# Patient Record
Sex: Male | Born: 1960 | Race: White | Hispanic: No | Marital: Married | Smoking: Never smoker
Health system: Southern US, Community
[De-identification: ages and names within clinical notes are randomized; demographics above are authoritative.]

## PROBLEM LIST (undated history)

## (undated) DIAGNOSIS — Z7901 Long term (current) use of anticoagulants: Secondary | ICD-10-CM

## (undated) DIAGNOSIS — O2230 Deep phlebothrombosis in pregnancy, unspecified trimester: Secondary | ICD-10-CM

## (undated) DIAGNOSIS — J309 Allergic rhinitis, unspecified: Secondary | ICD-10-CM

## (undated) DIAGNOSIS — K635 Polyp of colon: Secondary | ICD-10-CM

## (undated) DIAGNOSIS — J45909 Unspecified asthma, uncomplicated: Secondary | ICD-10-CM

## (undated) DIAGNOSIS — K219 Gastro-esophageal reflux disease without esophagitis: Secondary | ICD-10-CM

## (undated) DIAGNOSIS — E538 Deficiency of other specified B group vitamins: Secondary | ICD-10-CM

## (undated) DIAGNOSIS — D6852 Prothrombin gene mutation: Secondary | ICD-10-CM

## (undated) HISTORY — PX: FRACTURE SURGERY: SHX138

## (undated) HISTORY — PX: COLONOSCOPY: SHX174

## (undated) HISTORY — PX: FLEXIBLE SIGMOIDOSCOPY: SHX1649

## (undated) HISTORY — PX: HERNIA REPAIR: SHX51

## (undated) HISTORY — PX: NASAL SINUS SURGERY: SHX719

---

## 2002-02-05 DIAGNOSIS — O2230 Deep phlebothrombosis in pregnancy, unspecified trimester: Secondary | ICD-10-CM

## 2002-02-05 HISTORY — DX: Deep phlebothrombosis in pregnancy, unspecified trimester: O22.30

## 2005-11-12 ENCOUNTER — Ambulatory Visit: Payer: Self-pay | Admitting: Unknown Physician Specialty

## 2006-05-09 ENCOUNTER — Ambulatory Visit: Payer: Self-pay | Admitting: Otolaryngology

## 2007-06-18 ENCOUNTER — Ambulatory Visit: Payer: Self-pay | Admitting: Unknown Physician Specialty

## 2007-10-15 ENCOUNTER — Ambulatory Visit: Payer: Self-pay | Admitting: Internal Medicine

## 2011-04-14 ENCOUNTER — Emergency Department: Payer: Self-pay | Admitting: Emergency Medicine

## 2011-05-17 ENCOUNTER — Ambulatory Visit: Payer: Self-pay | Admitting: Unknown Physician Specialty

## 2011-05-18 LAB — PATHOLOGY REPORT

## 2013-06-09 DIAGNOSIS — I82409 Acute embolism and thrombosis of unspecified deep veins of unspecified lower extremity: Secondary | ICD-10-CM | POA: Insufficient documentation

## 2013-06-09 DIAGNOSIS — Z86718 Personal history of other venous thrombosis and embolism: Secondary | ICD-10-CM | POA: Insufficient documentation

## 2013-12-28 DIAGNOSIS — K219 Gastro-esophageal reflux disease without esophagitis: Secondary | ICD-10-CM | POA: Insufficient documentation

## 2014-06-18 ENCOUNTER — Other Ambulatory Visit: Payer: Self-pay | Admitting: Internal Medicine

## 2014-06-18 DIAGNOSIS — M5412 Radiculopathy, cervical region: Secondary | ICD-10-CM

## 2014-06-26 ENCOUNTER — Ambulatory Visit
Admission: RE | Admit: 2014-06-26 | Discharge: 2014-06-26 | Disposition: A | Discharge status: Home / Self Care | Payer: No Typology Code available for payment source | Source: Ambulatory Visit | Attending: Internal Medicine | Admitting: Internal Medicine

## 2014-06-26 DIAGNOSIS — M47812 Spondylosis without myelopathy or radiculopathy, cervical region: Secondary | ICD-10-CM | POA: Insufficient documentation

## 2014-06-26 DIAGNOSIS — M5010 Cervical disc disorder with radiculopathy, unspecified cervical region: Secondary | ICD-10-CM | POA: Diagnosis present

## 2014-06-26 DIAGNOSIS — M5382 Other specified dorsopathies, cervical region: Secondary | ICD-10-CM | POA: Insufficient documentation

## 2014-06-26 DIAGNOSIS — M5030 Other cervical disc degeneration, unspecified cervical region: Secondary | ICD-10-CM | POA: Insufficient documentation

## 2014-06-26 DIAGNOSIS — M5412 Radiculopathy, cervical region: Secondary | ICD-10-CM

## 2015-08-03 DIAGNOSIS — E538 Deficiency of other specified B group vitamins: Secondary | ICD-10-CM | POA: Insufficient documentation

## 2016-02-21 DIAGNOSIS — I82519 Chronic embolism and thrombosis of unspecified femoral vein: Secondary | ICD-10-CM | POA: Diagnosis not present

## 2016-04-19 DIAGNOSIS — I82519 Chronic embolism and thrombosis of unspecified femoral vein: Secondary | ICD-10-CM | POA: Diagnosis not present

## 2016-05-24 DIAGNOSIS — Z85828 Personal history of other malignant neoplasm of skin: Secondary | ICD-10-CM | POA: Diagnosis not present

## 2016-05-24 DIAGNOSIS — D225 Melanocytic nevi of trunk: Secondary | ICD-10-CM | POA: Diagnosis not present

## 2016-05-24 DIAGNOSIS — L821 Other seborrheic keratosis: Secondary | ICD-10-CM | POA: Diagnosis not present

## 2016-05-24 DIAGNOSIS — L570 Actinic keratosis: Secondary | ICD-10-CM | POA: Diagnosis not present

## 2016-06-01 DIAGNOSIS — I82519 Chronic embolism and thrombosis of unspecified femoral vein: Secondary | ICD-10-CM | POA: Diagnosis not present

## 2016-07-26 DIAGNOSIS — Z0000 Encounter for general adult medical examination without abnormal findings: Secondary | ICD-10-CM | POA: Diagnosis not present

## 2016-08-03 DIAGNOSIS — Z0000 Encounter for general adult medical examination without abnormal findings: Secondary | ICD-10-CM | POA: Diagnosis not present

## 2016-08-03 DIAGNOSIS — I82401 Acute embolism and thrombosis of unspecified deep veins of right lower extremity: Secondary | ICD-10-CM | POA: Diagnosis not present

## 2016-08-03 DIAGNOSIS — E538 Deficiency of other specified B group vitamins: Secondary | ICD-10-CM | POA: Diagnosis not present

## 2016-08-31 DIAGNOSIS — I82402 Acute embolism and thrombosis of unspecified deep veins of left lower extremity: Secondary | ICD-10-CM | POA: Diagnosis not present

## 2016-08-31 DIAGNOSIS — I82401 Acute embolism and thrombosis of unspecified deep veins of right lower extremity: Secondary | ICD-10-CM | POA: Diagnosis not present

## 2016-08-31 DIAGNOSIS — Z86010 Personal history of colonic polyps: Secondary | ICD-10-CM | POA: Diagnosis not present

## 2016-10-04 DIAGNOSIS — I82519 Chronic embolism and thrombosis of unspecified femoral vein: Secondary | ICD-10-CM | POA: Diagnosis not present

## 2016-11-06 DIAGNOSIS — I82519 Chronic embolism and thrombosis of unspecified femoral vein: Secondary | ICD-10-CM | POA: Diagnosis not present

## 2016-11-14 DIAGNOSIS — H00012 Hordeolum externum right lower eyelid: Secondary | ICD-10-CM | POA: Diagnosis not present

## 2016-12-06 ENCOUNTER — Encounter: Payer: Self-pay | Admitting: *Deleted

## 2016-12-07 ENCOUNTER — Ambulatory Visit
Admission: RE | Admit: 2016-12-07 | Discharge: 2016-12-07 | Disposition: A | Discharge status: Home / Self Care | Payer: Commercial Managed Care - HMO | Source: Ambulatory Visit | Attending: Unknown Physician Specialty | Admitting: Unknown Physician Specialty

## 2016-12-07 ENCOUNTER — Encounter
Admission: RE | Disposition: A | Discharge status: Home / Self Care | Payer: Self-pay | Source: Ambulatory Visit | Attending: Unknown Physician Specialty

## 2016-12-07 ENCOUNTER — Encounter: Payer: Self-pay | Admitting: *Deleted

## 2016-12-07 ENCOUNTER — Ambulatory Visit: Payer: Commercial Managed Care - HMO | Admitting: Certified Registered Nurse Anesthetist

## 2016-12-07 DIAGNOSIS — D120 Benign neoplasm of cecum: Secondary | ICD-10-CM | POA: Insufficient documentation

## 2016-12-07 DIAGNOSIS — Z86718 Personal history of other venous thrombosis and embolism: Secondary | ICD-10-CM | POA: Diagnosis not present

## 2016-12-07 DIAGNOSIS — K648 Other hemorrhoids: Secondary | ICD-10-CM | POA: Insufficient documentation

## 2016-12-07 DIAGNOSIS — K5730 Diverticulosis of large intestine without perforation or abscess without bleeding: Secondary | ICD-10-CM | POA: Diagnosis not present

## 2016-12-07 DIAGNOSIS — D123 Benign neoplasm of transverse colon: Secondary | ICD-10-CM | POA: Diagnosis not present

## 2016-12-07 DIAGNOSIS — Z1211 Encounter for screening for malignant neoplasm of colon: Secondary | ICD-10-CM | POA: Insufficient documentation

## 2016-12-07 DIAGNOSIS — Z7901 Long term (current) use of anticoagulants: Secondary | ICD-10-CM | POA: Insufficient documentation

## 2016-12-07 DIAGNOSIS — D122 Benign neoplasm of ascending colon: Secondary | ICD-10-CM | POA: Diagnosis not present

## 2016-12-07 DIAGNOSIS — J45909 Unspecified asthma, uncomplicated: Secondary | ICD-10-CM | POA: Diagnosis not present

## 2016-12-07 DIAGNOSIS — Z86010 Personal history of colonic polyps: Secondary | ICD-10-CM | POA: Diagnosis not present

## 2016-12-07 DIAGNOSIS — D125 Benign neoplasm of sigmoid colon: Secondary | ICD-10-CM | POA: Diagnosis not present

## 2016-12-07 DIAGNOSIS — K635 Polyp of colon: Secondary | ICD-10-CM | POA: Diagnosis not present

## 2016-12-07 DIAGNOSIS — D126 Benign neoplasm of colon, unspecified: Secondary | ICD-10-CM | POA: Diagnosis not present

## 2016-12-07 HISTORY — PX: COLONOSCOPY WITH PROPOFOL: SHX5780

## 2016-12-07 HISTORY — DX: Deficiency of other specified B group vitamins: E53.8

## 2016-12-07 HISTORY — DX: Allergic rhinitis, unspecified: J30.9

## 2016-12-07 HISTORY — DX: Deep phlebothrombosis in pregnancy, unspecified trimester: O22.30

## 2016-12-07 HISTORY — DX: Unspecified asthma, uncomplicated: J45.909

## 2016-12-07 HISTORY — DX: Gastro-esophageal reflux disease without esophagitis: K21.9

## 2016-12-07 HISTORY — DX: Polyp of colon: K63.5

## 2016-12-07 SURGERY — COLONOSCOPY WITH PROPOFOL
Anesthesia: General

## 2016-12-07 MED ORDER — PROPOFOL 10 MG/ML IV BOLUS
INTRAVENOUS | Status: AC
  Filled 2016-12-07: qty 20

## 2016-12-07 MED ORDER — PROPOFOL 500 MG/50ML IV EMUL
INTRAVENOUS | Status: AC
  Filled 2016-12-07: qty 50

## 2016-12-07 MED ORDER — PROPOFOL 10 MG/ML IV BOLUS
INTRAVENOUS | Status: DC | PRN
  Administered 2016-12-07: 30 mg via INTRAVENOUS
  Administered 2016-12-07: 20 mg via INTRAVENOUS
  Administered 2016-12-07: 40 mg via INTRAVENOUS

## 2016-12-07 MED ORDER — LIDOCAINE HCL (PF) 2 % IJ SOLN
INTRAMUSCULAR | Status: AC
  Filled 2016-12-07: qty 10

## 2016-12-07 MED ORDER — SODIUM CHLORIDE 0.9 % IV SOLN
INTRAVENOUS | Status: DC
  Administered 2016-12-07: 09:00:00 via INTRAVENOUS

## 2016-12-07 MED ORDER — LIDOCAINE HCL (CARDIAC) 20 MG/ML IV SOLN
INTRAVENOUS | Status: DC | PRN
  Administered 2016-12-07: 50 mg via INTRAVENOUS

## 2016-12-07 MED ORDER — PROPOFOL 500 MG/50ML IV EMUL
INTRAVENOUS | Status: DC | PRN
  Administered 2016-12-07: 160 ug/kg/min via INTRAVENOUS
  Administered 2016-12-07: 09:00:00 via INTRAVENOUS

## 2016-12-07 MED ORDER — SODIUM CHLORIDE 0.9 % IJ SOLN
INTRAMUSCULAR | Status: DC | PRN
  Administered 2016-12-07: 5 mL

## 2016-12-07 MED ORDER — PHENYLEPHRINE HCL 10 MG/ML IJ SOLN
INTRAMUSCULAR | Status: DC | PRN
  Administered 2016-12-07 (×4): 100 ug via INTRAVENOUS
  Administered 2016-12-07: 200 ug via INTRAVENOUS
  Administered 2016-12-07 (×2): 100 ug via INTRAVENOUS
  Administered 2016-12-07 (×2): 200 ug via INTRAVENOUS

## 2016-12-07 NOTE — Anesthesia Post-op Follow-up Note (Deleted)
Anesthesia QCDR form completed.        

## 2016-12-07 NOTE — Op Note (Signed)
[facility] Gastroenterology Patient Name: George Sharp Procedure Date: 12/07/2016 8:41 AM MRN: [id number] Account #: [id number] Date of Birth: 1960-04-22 Admit Type: Outpatient Age: 56 Room: [facility] ENDO ROOM 3 Gender: Male Note Status: Finalized Procedure:            Colonoscopy Indications:          High risk colon cancer surveillance: Personal history                        of colonic polyps Providers:            Manya Silvas, MD Referring MD:         Rusty Aus, MD (Referring MD) Medicines:            Propofol per Anesthesia Complications:        No immediate complications. Procedure:            Pre-Anesthesia Assessment:                       - After reviewing the risks and benefits, the patient                        was deemed in satisfactory condition to undergo the                        procedure.                       After obtaining informed consent, the colonoscope was                        passed under direct vision. Throughout the procedure,                        the patient's blood pressure, pulse, and oxygen                        saturations were monitored continuously. The                        Colonoscope was introduced through the anus and                        advanced to the the cecum, identified by appendiceal                        orifice and ileocecal valve. The colonoscopy was                        somewhat difficult due to a tortuous colon. Successful                        completion of the procedure was aided by applying                        abdominal pressure. The patient tolerated the procedure                        well. The quality of the bowel preparation was good. Findings:      A medium polyp was found in the cecum. The polyp  was sessile. The polyp       was removed with a saline injection-lift technique using a hot snare.       Resection and retrieval were complete. To prevent bleeding after the        polypectomy, two hemostatic clips were successfully placed. There was no       bleeding at the end of the procedure.      A small polyp was found in the ascending colon. The polyp was sessile.       The polyp was removed with a hot snare. Resection and retrieval were       complete.      A diminutive polyp was found in the ascending colon. The polyp was       sessile. The polyp was removed with a jumbo cold forceps. Resection and       retrieval were complete.      A diminutive polyp was found in the sigmoid colon. The polyp was       sessile. The polyp was removed with a hot snare. Resection and retrieval       were complete.      Many small-mouthed diverticula were found in the sigmoid colon,       descending colon, transverse colon and ascending colon.      Internal hemorrhoids were found during endoscopy. The hemorrhoids were       small and Grade I (internal hemorrhoids that do not prolapse).      The exam was otherwise without abnormality. Impression:           - One medium polyp in the cecum, removed using                        injection-lift and a hot snare. Resected and retrieved.                        Clips were placed.                       - One small polyp in the ascending colon, removed with                        a hot snare. Resected and retrieved.                       - One diminutive polyp in the ascending colon, removed                        with a jumbo cold forceps. Resected and retrieved.                       - One diminutive polyp in the sigmoid colon, removed                        with a hot snare. Resected and retrieved.                       - Diverticulosis in the sigmoid colon, in the                        descending colon, in the transverse colon and in the  ascending colon.                       - Internal hemorrhoids.                       - The examination was otherwise normal. Recommendation:       - Await pathology  results. Manya Silvas, MD 12/07/2016 9:53:11 AM This report has been signed electronically. Number of Addenda: 0 Note Initiated On: 12/07/2016 8:41 AM Scope Withdrawal Time: 0 hours 30 minutes 41 seconds  Total Procedure Duration: 0 hours 55 minutes 49 seconds       [facility]

## 2016-12-07 NOTE — Anesthesia Procedure Notes (Signed)
Date/Time: 12/07/2016 8:42 AM Performed by: Johnna Acosta Pre-anesthesia Checklist: Patient identified, Emergency Drugs available, Suction available, Patient being monitored and Timeout performed Patient Re-evaluated:Patient Re-evaluated prior to induction Oxygen Delivery Method: Nasal cannula

## 2016-12-07 NOTE — H&P (Signed)
   Primary Care Physician:  Rusty Aus, MD Primary Gastroenterologist:  Dr. Vira Agar  Pre-Procedure History & Physical: HPI:  George Sharp is a 56 y.o. male is here for an colonoscopy.   Past Medical History:  Diagnosis Date  . Allergic rhinitis   . Asthma   . B12 deficiency   . Colon polyps   . 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 Laryngopharyngeal reflux (LPR)     Past Surgical History:  Procedure Laterality Date  . COLONOSCOPY    . FLEXIBLE SIGMOIDOSCOPY    . HERNIA REPAIR    . NASAL SINUS SURGERY      Prior to Admission medications   Medication Sig Start Date End Date Taking? Authorizing Provider  acetaminophen (TYLENOL) 500 MG tablet Take 500 mg by mouth every 6 (six) hours as needed.   Yes [provider]  cetirizine (ZYRTEC) 10 MG tablet Take 10 mg by mouth daily.   Yes [provider]  triamcinolone cream (KENALOG) 0.1 % Apply 1 application topically 2 (two) times daily.   Yes [provider]  warfarin (COUMADIN) 10 MG tablet Take 10 mg by mouth daily at 6 PM.   Yes [provider]    Allergies as of 09/07/2016  . (Not on File)    History reviewed. No pertinent family history.  Social History   Social History  . Marital status: Married    Spouse name: N/A  . Number of children: N/A  . Years of education: N/A   Occupational History  . Not on file.   Social History Main Topics  . Smoking status: Never Smoker  . Smokeless tobacco: Never Used  . Alcohol use Yes  . Drug use: No  . Sexual activity: Not on file   Other Topics Concern  . Not on file   Social History Narrative  . No narrative on file    Review of Systems: See HPI, otherwise negative ROS  Physical Exam: BP 98/62   Pulse 60   Temp (!) 96.3 F (35.7 C) (Tympanic)   Resp 16   Ht 6' (1.829 m)   Wt 83 kg (183 lb)   SpO2 99%   BMI 24.82 kg/m  General:   Alert,  pleasant and cooperative in NAD Head:  Normocephalic and  atraumatic. Neck:  Supple; no masses or thyromegaly. Lungs:  Clear throughout to auscultation.    Heart:  Regular rate and rhythm. Abdomen:  Soft, nontender and nondistended. Normal bowel sounds, without guarding, and without rebound.   Neurologic:  Alert and  oriented x4;  grossly normal neurologically.  Impression/Plan: George Sharp is here for an colonoscopy to be performed for [facility] colon polyps.  Risks, benefits, limitations, and alternatives regarding  colonoscopy have been reviewed with the patient.  Questions have been answered.  All parties agreeable.   Gaylyn Cheers, MD  12/07/2016, 9:54 AM

## 2016-12-07 NOTE — Transfer of Care (Signed)
Immediate Anesthesia Transfer of Care Note  Patient: George Sharp  Procedure(s) Performed: COLONOSCOPY WITH PROPOFOL (N/A )  Patient Location: PACU  Anesthesia Type:General  Level of Consciousness: awake  Airway & Oxygen Therapy: Patient Spontanous Breathing and Patient connected to nasal cannula oxygen  Post-op Assessment: Report given to RN and Post -op Vital signs reviewed and stable  Post vital signs: Reviewed and stable  Last Vitals:  Vitals:   12/07/16 0821 12/07/16 0951  BP: 109/63 98/62  Pulse: 69 60  Resp: 16 16  Temp: (!) 36.1 C (!) 35.7 C  SpO2: 100% 99%    Last Pain:  Vitals:   12/07/16 0951  TempSrc: Tympanic         Complications: No apparent anesthesia complications

## 2016-12-07 NOTE — Anesthesia Post-op Follow-up Note (Signed)
Anesthesia QCDR form completed.        

## 2016-12-08 NOTE — Progress Notes (Signed)
Non-identifying Voicemail.  No message left. 

## 2016-12-10 ENCOUNTER — Encounter: Payer: Self-pay | Admitting: Unknown Physician Specialty

## 2016-12-10 LAB — SURGICAL PATHOLOGY

## 2016-12-10 NOTE — Anesthesia Preprocedure Evaluation (Signed)
Anesthesia Evaluation  Patient identified by MRN, date of birth, ID band Patient awake    Reviewed: Allergy & Precautions, H&P , NPO status , Patient's Chart, lab work & pertinent test results, reviewed documented beta blocker date and time   Airway Mallampati: II   Neck ROM: full    Dental  (+) Poor Dentition   Pulmonary neg pulmonary ROS, asthma ,    Pulmonary exam normal        Cardiovascular negative cardio ROS Normal cardiovascular exam Rhythm:regular Rate:Normal     Neuro/Psych negative neurological ROS  negative psych ROS   GI/Hepatic negative GI ROS, Neg liver ROS,   Endo/Other  negative endocrine ROS  Renal/GU negative Renal ROS  negative genitourinary   Musculoskeletal   Abdominal   Peds  Hematology negative hematology ROS (+)   Anesthesia Other Findings Past Medical History: No date: Allergic rhinitis No date: Asthma No date: B12 deficiency No date: Colon polyps No date: DVT (deep vein thrombosis) in pregnancy ([place]) No date: Laryngopharyngeal reflux (LPR) Past Surgical History: No date: COLONOSCOPY No date: FLEXIBLE SIGMOIDOSCOPY No date: HERNIA REPAIR No date: NASAL SINUS SURGERY BMI    Body Mass Index:  24.82 kg/m     Reproductive/Obstetrics negative OB ROS                             Anesthesia Physical Anesthesia Plan  ASA: III  Anesthesia Plan:    Post-op Pain Management:    Induction:   PONV Risk Score and Plan: 1  Airway Management Planned:   Additional Equipment:   Intra-op Plan:   Post-operative Plan:   Informed Consent: I have reviewed the patients History and Physical, chart, labs and discussed the procedure including the risks, benefits and alternatives for the proposed anesthesia with the patient or authorized representative who has indicated his/her understanding and acceptance.   Dental Advisory Given  Plan Discussed with:  CRNA  Anesthesia Plan Comments:         Anesthesia Quick Evaluation

## 2016-12-10 NOTE — Anesthesia Postprocedure Evaluation (Signed)
Anesthesia Post Note  Patient: George Sharp  Procedure(s) Performed: COLONOSCOPY WITH PROPOFOL (N/A )  Patient location during evaluation: PACU Anesthesia Type: General Level of consciousness: awake and alert Pain management: pain level controlled Vital Signs Assessment: post-procedure vital signs reviewed and stable Respiratory status: spontaneous breathing, nonlabored ventilation, respiratory function stable and patient connected to nasal cannula oxygen Cardiovascular status: blood pressure returned to baseline and stable Postop Assessment: no apparent nausea or vomiting Anesthetic complications: no     Last Vitals:  Vitals:   12/07/16 1011 12/07/16 1021  BP: 96/74 98/73  Pulse: (!) 53 (!) 53  Resp: 15 14  Temp:    SpO2: 100% 100%    Last Pain:  Vitals:   12/07/16 0951  TempSrc: Tympanic                 [name]

## 2017-01-11 DIAGNOSIS — I82519 Chronic embolism and thrombosis of unspecified femoral vein: Secondary | ICD-10-CM | POA: Diagnosis not present

## 2017-02-25 DIAGNOSIS — I82519 Chronic embolism and thrombosis of unspecified femoral vein: Secondary | ICD-10-CM | POA: Diagnosis not present

## 2017-03-08 DIAGNOSIS — J0100 Acute maxillary sinusitis, unspecified: Secondary | ICD-10-CM | POA: Diagnosis not present

## 2017-04-05 DIAGNOSIS — I82519 Chronic embolism and thrombosis of unspecified femoral vein: Secondary | ICD-10-CM | POA: Diagnosis not present

## 2017-05-06 DIAGNOSIS — I82401 Acute embolism and thrombosis of unspecified deep veins of right lower extremity: Secondary | ICD-10-CM | POA: Diagnosis not present

## 2017-06-12 DIAGNOSIS — J0100 Acute maxillary sinusitis, unspecified: Secondary | ICD-10-CM | POA: Diagnosis not present

## 2017-06-12 DIAGNOSIS — I82401 Acute embolism and thrombosis of unspecified deep veins of right lower extremity: Secondary | ICD-10-CM | POA: Diagnosis not present

## 2017-06-28 DIAGNOSIS — S61210A Laceration without foreign body of right index finger without damage to nail, initial encounter: Secondary | ICD-10-CM | POA: Diagnosis not present

## 2017-06-28 DIAGNOSIS — S6991XA Unspecified injury of right wrist, hand and finger(s), initial encounter: Secondary | ICD-10-CM | POA: Diagnosis not present

## 2017-07-17 DIAGNOSIS — Z0000 Encounter for general adult medical examination without abnormal findings: Secondary | ICD-10-CM | POA: Diagnosis not present

## 2017-07-29 DIAGNOSIS — R0600 Dyspnea, unspecified: Secondary | ICD-10-CM | POA: Diagnosis not present

## 2017-07-29 DIAGNOSIS — R0609 Other forms of dyspnea: Secondary | ICD-10-CM | POA: Diagnosis not present

## 2017-07-29 DIAGNOSIS — M79674 Pain in right toe(s): Secondary | ICD-10-CM | POA: Diagnosis not present

## 2017-07-29 DIAGNOSIS — G8929 Other chronic pain: Secondary | ICD-10-CM | POA: Diagnosis not present

## 2017-08-05 DIAGNOSIS — I82401 Acute embolism and thrombosis of unspecified deep veins of right lower extremity: Secondary | ICD-10-CM | POA: Diagnosis not present

## 2017-08-05 DIAGNOSIS — E538 Deficiency of other specified B group vitamins: Secondary | ICD-10-CM | POA: Diagnosis not present

## 2017-08-05 DIAGNOSIS — Z0000 Encounter for general adult medical examination without abnormal findings: Secondary | ICD-10-CM | POA: Diagnosis not present

## 2017-08-05 DIAGNOSIS — D369 Benign neoplasm, unspecified site: Secondary | ICD-10-CM | POA: Insufficient documentation

## 2017-08-26 DIAGNOSIS — L03012 Cellulitis of left finger: Secondary | ICD-10-CM | POA: Diagnosis not present

## 2017-09-16 DIAGNOSIS — I82401 Acute embolism and thrombosis of unspecified deep veins of right lower extremity: Secondary | ICD-10-CM | POA: Diagnosis not present

## 2017-10-10 DIAGNOSIS — E538 Deficiency of other specified B group vitamins: Secondary | ICD-10-CM | POA: Diagnosis not present

## 2017-10-10 DIAGNOSIS — I82401 Acute embolism and thrombosis of unspecified deep veins of right lower extremity: Secondary | ICD-10-CM | POA: Diagnosis not present

## 2017-11-19 DIAGNOSIS — I82401 Acute embolism and thrombosis of unspecified deep veins of right lower extremity: Secondary | ICD-10-CM | POA: Diagnosis not present

## 2017-11-26 DIAGNOSIS — M79644 Pain in right finger(s): Secondary | ICD-10-CM | POA: Diagnosis not present

## 2017-12-27 DIAGNOSIS — I82401 Acute embolism and thrombosis of unspecified deep veins of right lower extremity: Secondary | ICD-10-CM | POA: Diagnosis not present

## 2018-01-14 DIAGNOSIS — S63641A Sprain of metacarpophalangeal joint of right thumb, initial encounter: Secondary | ICD-10-CM | POA: Insufficient documentation

## 2018-01-14 DIAGNOSIS — S5331XA Traumatic rupture of right ulnar collateral ligament, initial encounter: Secondary | ICD-10-CM | POA: Diagnosis not present

## 2018-01-14 DIAGNOSIS — M79644 Pain in right finger(s): Secondary | ICD-10-CM | POA: Insufficient documentation

## 2018-01-27 DIAGNOSIS — D481 Neoplasm of uncertain behavior of connective and other soft tissue: Secondary | ICD-10-CM | POA: Diagnosis not present

## 2018-01-27 DIAGNOSIS — M779 Enthesopathy, unspecified: Secondary | ICD-10-CM | POA: Diagnosis not present

## 2018-01-27 DIAGNOSIS — S5331XA Traumatic rupture of right ulnar collateral ligament, initial encounter: Secondary | ICD-10-CM | POA: Diagnosis not present

## 2018-01-30 DIAGNOSIS — L570 Actinic keratosis: Secondary | ICD-10-CM | POA: Diagnosis not present

## 2018-01-30 DIAGNOSIS — S5331XD Traumatic rupture of right ulnar collateral ligament, subsequent encounter: Secondary | ICD-10-CM | POA: Diagnosis not present

## 2018-01-30 DIAGNOSIS — L814 Other melanin hyperpigmentation: Secondary | ICD-10-CM | POA: Diagnosis not present

## 2018-01-30 DIAGNOSIS — M79644 Pain in right finger(s): Secondary | ICD-10-CM | POA: Diagnosis not present

## 2018-01-30 DIAGNOSIS — L821 Other seborrheic keratosis: Secondary | ICD-10-CM | POA: Diagnosis not present

## 2018-01-30 DIAGNOSIS — M25641 Stiffness of right hand, not elsewhere classified: Secondary | ICD-10-CM | POA: Diagnosis not present

## 2018-01-30 DIAGNOSIS — Z85828 Personal history of other malignant neoplasm of skin: Secondary | ICD-10-CM | POA: Diagnosis not present

## 2018-02-17 DIAGNOSIS — I82401 Acute embolism and thrombosis of unspecified deep veins of right lower extremity: Secondary | ICD-10-CM | POA: Diagnosis not present

## 2018-02-27 DIAGNOSIS — S5331XA Traumatic rupture of right ulnar collateral ligament, initial encounter: Secondary | ICD-10-CM | POA: Diagnosis not present

## 2018-02-27 DIAGNOSIS — S5331XD Traumatic rupture of right ulnar collateral ligament, subsequent encounter: Secondary | ICD-10-CM | POA: Diagnosis not present

## 2018-02-27 DIAGNOSIS — M25641 Stiffness of right hand, not elsewhere classified: Secondary | ICD-10-CM | POA: Diagnosis not present

## 2018-02-27 DIAGNOSIS — Z85828 Personal history of other malignant neoplasm of skin: Secondary | ICD-10-CM | POA: Diagnosis not present

## 2018-02-27 DIAGNOSIS — B354 Tinea corporis: Secondary | ICD-10-CM | POA: Diagnosis not present

## 2018-03-10 DIAGNOSIS — R0982 Postnasal drip: Secondary | ICD-10-CM | POA: Diagnosis not present

## 2018-03-10 DIAGNOSIS — R05 Cough: Secondary | ICD-10-CM | POA: Diagnosis not present

## 2018-03-14 DIAGNOSIS — S5331XD Traumatic rupture of right ulnar collateral ligament, subsequent encounter: Secondary | ICD-10-CM | POA: Diagnosis not present

## 2018-03-14 DIAGNOSIS — M25641 Stiffness of right hand, not elsewhere classified: Secondary | ICD-10-CM | POA: Diagnosis not present

## 2018-03-14 DIAGNOSIS — S5331XA Traumatic rupture of right ulnar collateral ligament, initial encounter: Secondary | ICD-10-CM | POA: Diagnosis not present

## 2018-03-21 DIAGNOSIS — M25641 Stiffness of right hand, not elsewhere classified: Secondary | ICD-10-CM | POA: Diagnosis not present

## 2018-03-21 DIAGNOSIS — K219 Gastro-esophageal reflux disease without esophagitis: Secondary | ICD-10-CM | POA: Diagnosis not present

## 2018-03-21 DIAGNOSIS — I82401 Acute embolism and thrombosis of unspecified deep veins of right lower extremity: Secondary | ICD-10-CM | POA: Diagnosis not present

## 2018-03-25 DIAGNOSIS — R29898 Other symptoms and signs involving the musculoskeletal system: Secondary | ICD-10-CM | POA: Diagnosis not present

## 2018-03-25 DIAGNOSIS — S5331XD Traumatic rupture of right ulnar collateral ligament, subsequent encounter: Secondary | ICD-10-CM | POA: Diagnosis not present

## 2018-03-25 DIAGNOSIS — S5331XA Traumatic rupture of right ulnar collateral ligament, initial encounter: Secondary | ICD-10-CM | POA: Diagnosis not present

## 2018-04-01 DIAGNOSIS — S5331XA Traumatic rupture of right ulnar collateral ligament, initial encounter: Secondary | ICD-10-CM | POA: Diagnosis not present

## 2018-04-01 DIAGNOSIS — M25641 Stiffness of right hand, not elsewhere classified: Secondary | ICD-10-CM | POA: Diagnosis not present

## 2018-04-01 DIAGNOSIS — R29898 Other symptoms and signs involving the musculoskeletal system: Secondary | ICD-10-CM | POA: Diagnosis not present

## 2018-04-09 DIAGNOSIS — S5331XA Traumatic rupture of right ulnar collateral ligament, initial encounter: Secondary | ICD-10-CM | POA: Diagnosis not present

## 2018-04-09 DIAGNOSIS — S5331XD Traumatic rupture of right ulnar collateral ligament, subsequent encounter: Secondary | ICD-10-CM | POA: Diagnosis not present

## 2018-04-09 DIAGNOSIS — M25641 Stiffness of right hand, not elsewhere classified: Secondary | ICD-10-CM | POA: Diagnosis not present

## 2018-04-28 DIAGNOSIS — R29898 Other symptoms and signs involving the musculoskeletal system: Secondary | ICD-10-CM | POA: Diagnosis not present

## 2018-04-28 DIAGNOSIS — S5331XD Traumatic rupture of right ulnar collateral ligament, subsequent encounter: Secondary | ICD-10-CM | POA: Diagnosis not present

## 2018-05-06 DIAGNOSIS — J45902 Unspecified asthma with status asthmaticus: Secondary | ICD-10-CM | POA: Diagnosis not present

## 2018-05-06 DIAGNOSIS — I82401 Acute embolism and thrombosis of unspecified deep veins of right lower extremity: Secondary | ICD-10-CM | POA: Diagnosis not present

## 2018-05-06 DIAGNOSIS — J40 Bronchitis, not specified as acute or chronic: Secondary | ICD-10-CM | POA: Diagnosis not present

## 2018-06-09 DIAGNOSIS — I82401 Acute embolism and thrombosis of unspecified deep veins of right lower extremity: Secondary | ICD-10-CM | POA: Diagnosis not present

## 2018-10-09 ENCOUNTER — Other Ambulatory Visit: Payer: Self-pay

## 2018-10-09 ENCOUNTER — Ambulatory Visit (INDEPENDENT_AMBULATORY_CARE_PROVIDER_SITE_OTHER): Payer: 59 | Admitting: Sports Medicine

## 2018-10-09 DIAGNOSIS — M5412 Radiculopathy, cervical region: Secondary | ICD-10-CM | POA: Insufficient documentation

## 2018-10-09 MED ORDER — PREDNISONE 10 MG PO TABS: ORAL_TABLET | ORAL | 0 refills | Status: DC

## 2018-10-09 NOTE — Progress Notes (Signed)
PCP: Rusty Aus, MD  Subjective:   HPI: Patient is a 58 y.o. male here for about 2 weeks of generalized right shoulder pain.  Patient states that he has not previously had any issues with his right shoulder in the past.  He had sudden onset of pain while sleeping about 2 weeks ago and woke him up from sleep.  He continues to have this same pain every night since onset.  He has seen his primary care doctor who prescribed muscle relaxer which helped him sleep when taken right before bed.  He has noticed that when not taking the muscle relaxer the pain is the same.  During the day patient has a constant mild achy feeling in his right shoulder which is not exacerbated by motion or activity.  However, patient has decreased his activity level since the onset of this symptom and has not been lifting weights as he normally did prior.  Patient denies any decreased range of motion, decreased strength, numbness, tingling, grasp weakness, erythema/warmth/swelling of the joint.  Denies fever or systemic symptoms.  Denies any radiation of the pain from shoulder down his hand.  Patient has had previous nerve impingement symptoms on the left neck and shoulder receiving multiple cervical MRIs in the past.  Most recent cervical MRI in 2016 showed mild to moderate disc space narrowing in the C3-C6 spaces.  Patient continues to have intermittent flareups of issues with his left shoulder.  This is the first time that he has had any issues with his right shoulder and the sensation is completely different than described of left shoulder. Patient will occasionally take Tylenol for pain but has not taken NSAIDs as this can interact with his warfarin.  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  Patient primary doctor recommended switching to Eliquis and had provided a prescription for the patient which he had brought to the pharmacy previously.  Prior  to this presentation patient was considering switching to Eliquis. 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  He completed intensive hand physical therapy for this.  He states that he has at baseline at this time.  Review of Systems: See HPI above.     Objective:  BP 110/78   Ht 6\' 1"  (1.854 m)   Wt 185 lb (83.9 kg)   BMI 24.41 kg/m   Physical Exam: Gen: NAD, comfortable in exam room Right Hand Exam   Tenderness  The patient is experiencing no tenderness.   Range of Motion  The patient has normal right wrist ROM.   Muscle Strength  The patient has normal right wrist strength.  Other  Erythema: absent Scars: present Sensation: normal Pulse: present  Comments:  Well-healed scar from recent surgery on right thumb.    Left Hand Exam   Tenderness  The patient is experiencing no tenderness.   Range of Motion  The patient has normal left wrist ROM.  Muscle Strength  The patient has normal left wrist strength.  Other  Sensation: normal Pulse: present   Right Shoulder Exam  Right shoulder exam is normal.  Tenderness  The patient is experiencing no tenderness.  Range of Motion  The patient has normal right shoulder ROM. Active abduction: normal  Passive abduction: normal  Extension: normal  External rotation: normal  Forward flexion: normal   Muscle Strength  The patient has normal right shoulder strength (including deltoids  and rotator cuff muscles).  Tests  Apprehension: negative Hawkins test: negative Impingement: negative Drop arm: negative  Other  Erythema: absent Scars: absent Sensation: normal Pulse: present  Comments:  Negative for tenderness to palpation along described tender area which spans from supra midclavicular line to lateral deltoid.  Negative for bogginess, atrophy, muscle rigidity, erythema, swelling, increased warmth.  By general observation left and right shoulder appear symmetrical.   Patient endorsed mild "soreness" in that area with overhead motions but no radiation of the pain down his arm.    Left Shoulder Exam  Left shoulder exam is normal.  Tenderness  The patient is experiencing no tenderness.   Range of Motion  The patient has normal left shoulder ROM. Internal rotation 0 degrees: normal  Internal rotation 90 degrees: normal   Muscle Strength  The patient has normal left shoulder strength.  Tests  Apprehension: negative Hawkins test: negative Impingement: negative Drop arm: negative     Assessment & Plan:  1.  Cervical radiculopathy-atypical.  Acute on chronic.   Disc space narrowing at C3-C7 as demonstrated by cervical MRI in 2016 and chronically symptomatic on the left.  Likely progressed to involve symptoms on the right now. -Trial of prednisone after patient consulting with PCP for interaction with warfarin. -Referring for PT -Avoid overhead weightbearing exercises and activities. -Follow-up in 4 weeks or sooner if symptoms are worsening and consider repeat cervical MRI at that time if worsening. -Provided patient with precautions for returning sooner and education for avoidance of exacerbation Avoid NSAIDs due to warfarin use, can use Tylenol if helping and/or continue using the prescribed muscle relaxer from PCP at nighttime for sleep as short-term solution  Patient seen and evaluated with the resident.  I agree with the above plan of care.  Patient's MRI from 2016 showed a bulging disc at C3-C4 which is likely symptomatic today.  Treatment as above.  Patient will start steroids but will talk with his PCP before starting.  He is on warfarin but transitioning to Eliquis.  He has taken prednisone in the past while on warfarin without any problems.  He will start physical therapy with Cherlyn Roberts and will follow-up with me in 3 to 4 weeks.  If symptoms persist, consider imaging at that time including possible MRI.  Of note, exam of his right shoulder  today is fairly benign.  I think his pain is referred from his cervical spine and not true shoulder pathology.

## 2018-10-09 NOTE — Assessment & Plan Note (Signed)
Disc space narrowing at C3-C7 as demonstrated by cervical MRI in 2016 and chronically symptomatic on the left.  Likely progressed to involve symptoms on the right now. Trial of prednisone after patient consulting with PCP for interaction with warfarin. Referring for PT Avoid overhead weightbearing exercises and activities. Follow-up in 4 weeks or sooner if symptoms are worsening. Avoid NSAIDs due to warfarin use, can use Tylenol if helping and/or continue using the prescribed muscle relaxer from PCP at nighttime for sleep as short-term solution

## 2018-10-09 NOTE — Patient Instructions (Signed)
It was a pleasure to see you today!  To summarize our discussion for this visit:  You are having shoulder/arm pain for about 2 weeks that appears to be related to your neck/nerve problems. We will try conservative measures of short course of steroids and Physical therapy.  Please follow up with up in about 4 weeks or sooner if your symptoms seem to be getting much worse.  For the steroids.... they can interact with your warfarin and increase your risk of bleeding. Please speak with your primary doctor before starting this combination of medications.  If taken together your doctor may suggest closer monitoring of your INR.  If starting Eliquis instead of warfarin, there would be decreased risk of interaction.  Please return to our clinic to see me in about 4 weeks.  Call the clinic at [phone] if your symptoms worsen or you have any concerns.   Thank you for allowing me to take part in your care,  Dr. Doristine Mango

## 2018-10-10 NOTE — Addendum Note (Signed)
Addended by: Lilia Argue R on: 10/10/2018 09:29 AM   Modules accepted: Level of Service

## 2018-10-16 ENCOUNTER — Ambulatory Visit: Payer: Commercial Managed Care - HMO | Admitting: Sports Medicine

## 2018-11-06 ENCOUNTER — Ambulatory Visit: Payer: Commercial Managed Care - HMO | Admitting: Sports Medicine

## 2019-03-02 ENCOUNTER — Ambulatory Visit: Payer: BC Managed Care – PPO | Admitting: Physical Therapy

## 2019-03-03 ENCOUNTER — Other Ambulatory Visit: Payer: Self-pay

## 2019-03-03 ENCOUNTER — Encounter: Payer: Self-pay | Admitting: Physical Therapy

## 2019-03-03 ENCOUNTER — Ambulatory Visit: Payer: BC Managed Care – PPO | Attending: Neurology | Admitting: Physical Therapy

## 2019-03-03 DIAGNOSIS — R262 Difficulty in walking, not elsewhere classified: Secondary | ICD-10-CM | POA: Diagnosis present

## 2019-03-03 DIAGNOSIS — M6281 Muscle weakness (generalized): Secondary | ICD-10-CM | POA: Diagnosis not present

## 2019-03-03 NOTE — Therapy (Signed)
[place] MAIN [facility] SERVICES [address] [place], [place], [address] Phone: [phone]   Fax:  [phone]  Physical Therapy Evaluation  Patient Details  Name: George Sharp MRN: [id number] Date of Birth: 12/30/1960 Referring Provider (PT): Jennings Books   Encounter Date: 03/03/2019  PT End of Session - 03/03/19 1639    Visit Number  1    Number of Visits  1    Date for PT Re-Evaluation  --    PT Start Time  0445    PT Stop Time  0510    PT Time Calculation (min)  25 min    Activity Tolerance  Patient tolerated treatment well    Behavior During Therapy  [facility] for tasks assessed/performed       Past Medical History:  Diagnosis Date  . Allergic rhinitis   . Asthma   . B12 deficiency   . Colon polyps   . DVT (deep vein thrombosis) in pregnancy   . Laryngopharyngeal reflux (LPR)     Past Surgical History:  Procedure Laterality Date  . COLONOSCOPY    . COLONOSCOPY WITH PROPOFOL N/A 12/07/2016   Procedure: COLONOSCOPY WITH PROPOFOL;  Surgeon: Manya Silvas, MD;  Location: [facility] ENDOSCOPY;  Service: Endoscopy;  Laterality: N/A;  . FLEXIBLE SIGMOIDOSCOPY    . HERNIA REPAIR    . NASAL SINUS SURGERY      There were no vitals filed for this visit.   Subjective Assessment - 03/03/19 1645    Subjective  Patient reports that he has a weak right foot and it is getting better. He is active and walking and participating in outdoor activities.    Pertinent History  Patient had a + Covid test 01/09/19 and then foot had cramps that were very painful and then he noticed his right foot had a foot drop. He has been walking and it is getting better.    How long can you stand comfortably?  unlimited    How long can you walk comfortably?  unlimited    Patient Stated Goals  to get his rigth foot stronger    Currently in Pain?  No/denies    Multiple Pain Sites  No         [facility] PT Assessment - 03/03/19 1637      Assessment   Medical  Diagnosis  R foot drop    Referring Provider (PT)  shah, Hemang    Hand Dominance  Right    Prior Therapy  none      Precautions   Precautions  None      Restrictions   Weight Bearing Restrictions  No      Balance Screen   Has the patient fallen in the past 6 months  No    Has the patient had a decrease in activity level because of a fear of falling?   No    Is the patient reluctant to leave their home because of a fear of falling?   No      Home Social worker  Private residence    Living Arrangements  Spouse/significant other    Available Help at Discharge  Family    Type of [place] to enter    Entrance Stairs-Number of Steps  2    Entrance Stairs-Rails  Left    Home Layout  Two level    Alternate Level Stairs-Number of Steps  12  Home Equipment  None      Prior Function   Level of Independence  Independent    Vocation  Full time employment    Vocation Requirements  standing/walking/sitting    Leisure  outdoor activities      Cognition   Overall Cognitive Status  Within Functional Limits for tasks assessed         POSTURE: WNL   PROM/AROM: WFL BUE and BLE  STRENGTH:  Graded on a 0-5 scale Muscle Group Left Right                          Hip Flex 5/5 5/5  Hip Abd 5/5 5/5  Hip Add 5/5 5/5  Hip Ext 5/5 5/5  Hip IR/ER 5/5 5/5  Knee Flex 5/5 5/5  Knee Ext 5/5 5/5  Ankle DF 5/5 4+/5  Ankle PF 5/5 4+/5   SENSATION: numbness in R dorsal foot and 2nd, 3rd, 4th toes   FUNCTIONAL MOBILITY: WNL   BALANCE: WNL   GAIT: Patient has slight R drop foot during ambulation , no AD, normal gait speed       Photo score 85/100 Risk adjusted photo score  65/100  Treatment: HEP instruction: Heel raises, single foot x 20  Ankle Eversion with GTB x 20  Ankle DF with GTB x 20 Standing on foam , single leg and head turns x 2 mins      Objective measurements completed on examination: See above findings.               PT Education - 03/03/19 1638    Education Details  plan of care    Person(s) Educated  Patient    Methods  Explanation    Comprehension  Verbalized understanding       PT Short Term Goals - 03/03/19 1746      PT SHORT TERM GOAL #1   Title  Patient will be independent with HEP for strengthening R ankle/foot.    Time  1    Period  Days    Status  Achieved                Plan - 03/03/19 1724    Clinical Impression Statement  Patient has a minimal strength deficit  in RLE ankle DF 4+/5. He is able to perform single ankle PF standng x 20 with slight decreased heel height. He has no ROM deficit or balance deficits. He reports numbness in dorsal foot and second, third and forth metatarsals. He has no reports of pain. He was instructed in HEP for strengthening of right ankle. Patient was instructed in HEP and has no skilled PT needs to continue therapy at this time.    Stability/Clinical Decision Making  Stable/Uncomplicated    Clinical Decision Making  Low    Rehab Potential  Excellent    PT Frequency  One time visit    PT Treatment/Interventions  Therapeutic exercise    PT Home Exercise Plan  Instructed in HEP for right ankle strengthening    Consulted and Agree with Plan of Care  Patient       Patient will benefit from skilled therapeutic intervention in order to improve the following deficits and impairments:  Decreased strength, Impaired sensation  Visit Diagnosis: Muscle weakness (generalized)  Difficulty in walking, not elsewhere classified     Problem List Patient Active Problem List   Diagnosis Date Noted  . Cervical radiculopathy 10/09/2018    Fadia Marlar,  Sherryl Barters, PT DPT 03/03/2019, 5:51 PM  [place] MAIN [facility] SERVICES [address] [place], [place], [address] Phone: [phone]   Fax:  [phone]  Name: George Sharp MRN: [id number] Date of Birth: 06-04-1960

## 2019-03-04 ENCOUNTER — Ambulatory Visit: Payer: BC Managed Care – PPO | Admitting: Physical Therapy

## 2019-03-09 ENCOUNTER — Ambulatory Visit: Payer: BC Managed Care – PPO | Admitting: Physical Therapy

## 2019-03-11 ENCOUNTER — Ambulatory Visit: Payer: BC Managed Care – PPO | Admitting: Physical Therapy

## 2019-03-16 ENCOUNTER — Ambulatory Visit: Payer: BC Managed Care – PPO | Admitting: Physical Therapy

## 2019-03-18 ENCOUNTER — Ambulatory Visit: Payer: BC Managed Care – PPO | Admitting: Physical Therapy

## 2019-03-23 ENCOUNTER — Ambulatory Visit: Payer: BC Managed Care – PPO | Admitting: Physical Therapy

## 2019-03-25 ENCOUNTER — Ambulatory Visit: Payer: BC Managed Care – PPO | Admitting: Physical Therapy

## 2019-03-30 ENCOUNTER — Ambulatory Visit: Payer: BC Managed Care – PPO | Admitting: Physical Therapy

## 2019-04-01 ENCOUNTER — Ambulatory Visit: Payer: BC Managed Care – PPO | Admitting: Physical Therapy

## 2019-04-06 ENCOUNTER — Ambulatory Visit: Payer: BC Managed Care – PPO | Admitting: Physical Therapy

## 2019-04-08 ENCOUNTER — Ambulatory Visit: Payer: BC Managed Care – PPO | Admitting: Physical Therapy

## 2019-04-13 ENCOUNTER — Ambulatory Visit: Payer: BC Managed Care – PPO | Admitting: Physical Therapy

## 2019-04-15 ENCOUNTER — Ambulatory Visit: Payer: BC Managed Care – PPO | Admitting: Physical Therapy

## 2019-04-20 ENCOUNTER — Ambulatory Visit: Payer: BC Managed Care – PPO | Admitting: Physical Therapy

## 2019-04-22 ENCOUNTER — Ambulatory Visit: Payer: BC Managed Care – PPO | Admitting: Physical Therapy

## 2019-04-27 ENCOUNTER — Ambulatory Visit: Payer: BC Managed Care – PPO | Admitting: Physical Therapy

## 2019-04-29 ENCOUNTER — Ambulatory Visit: Payer: BC Managed Care – PPO | Admitting: Physical Therapy

## 2019-08-26 ENCOUNTER — Other Ambulatory Visit: Payer: Self-pay | Admitting: Internal Medicine

## 2019-08-26 DIAGNOSIS — R7401 Elevation of levels of liver transaminase levels: Secondary | ICD-10-CM

## 2019-08-26 DIAGNOSIS — E559 Vitamin D deficiency, unspecified: Secondary | ICD-10-CM | POA: Insufficient documentation

## 2019-08-26 DIAGNOSIS — Z0000 Encounter for general adult medical examination without abnormal findings: Secondary | ICD-10-CM

## 2019-09-01 ENCOUNTER — Other Ambulatory Visit: Payer: Self-pay

## 2019-09-01 ENCOUNTER — Ambulatory Visit
Admission: RE | Admit: 2019-09-01 | Discharge: 2019-09-01 | Disposition: A | Discharge status: Home / Self Care | Payer: BC Managed Care – PPO | Source: Ambulatory Visit | Attending: Internal Medicine | Admitting: Internal Medicine

## 2019-09-01 DIAGNOSIS — Z0000 Encounter for general adult medical examination without abnormal findings: Secondary | ICD-10-CM

## 2019-09-01 DIAGNOSIS — R7401 Elevation of levels of liver transaminase levels: Secondary | ICD-10-CM | POA: Diagnosis present

## 2020-02-15 ENCOUNTER — Other Ambulatory Visit: Admission: RE | Admit: 2020-02-15 | Payer: BC Managed Care – PPO | Source: Ambulatory Visit

## 2020-02-17 ENCOUNTER — Encounter: Admission: RE | Payer: Self-pay | Source: Home / Self Care

## 2020-02-17 ENCOUNTER — Ambulatory Visit
Admission: RE | Admit: 2020-02-17 | Payer: BC Managed Care – PPO | Source: Home / Self Care | Admitting: Internal Medicine

## 2020-02-17 SURGERY — COLONOSCOPY WITH PROPOFOL
Anesthesia: General

## 2020-02-19 ENCOUNTER — Emergency Department (HOSPITAL_COMMUNITY)
Admission: EM | Admit: 2020-02-19 | Discharge: 2020-02-19 | Discharge status: Absent without leave | Payer: BC Managed Care – PPO

## 2020-02-19 ENCOUNTER — Other Ambulatory Visit: Payer: Self-pay

## 2020-07-19 ENCOUNTER — Encounter: Payer: Self-pay | Admitting: Internal Medicine

## 2020-07-20 ENCOUNTER — Ambulatory Visit
Admission: RE | Admit: 2020-07-20 | Discharge: 2020-07-20 | Disposition: A | Discharge status: Home / Self Care | Payer: BC Managed Care – PPO | Attending: Internal Medicine | Admitting: Internal Medicine

## 2020-07-20 ENCOUNTER — Other Ambulatory Visit: Payer: Self-pay

## 2020-07-20 ENCOUNTER — Encounter: Payer: Self-pay | Admitting: Internal Medicine

## 2020-07-20 ENCOUNTER — Encounter
Admission: RE | Disposition: A | Discharge status: Home / Self Care | Payer: Self-pay | Source: Home / Self Care | Attending: Internal Medicine

## 2020-07-20 ENCOUNTER — Ambulatory Visit: Payer: BC Managed Care – PPO | Admitting: Anesthesiology

## 2020-07-20 DIAGNOSIS — D123 Benign neoplasm of transverse colon: Secondary | ICD-10-CM | POA: Diagnosis not present

## 2020-07-20 DIAGNOSIS — D125 Benign neoplasm of sigmoid colon: Secondary | ICD-10-CM | POA: Diagnosis not present

## 2020-07-20 DIAGNOSIS — K5730 Diverticulosis of large intestine without perforation or abscess without bleeding: Secondary | ICD-10-CM | POA: Diagnosis not present

## 2020-07-20 DIAGNOSIS — Z86718 Personal history of other venous thrombosis and embolism: Secondary | ICD-10-CM | POA: Insufficient documentation

## 2020-07-20 DIAGNOSIS — Z1211 Encounter for screening for malignant neoplasm of colon: Secondary | ICD-10-CM | POA: Insufficient documentation

## 2020-07-20 DIAGNOSIS — Z7901 Long term (current) use of anticoagulants: Secondary | ICD-10-CM | POA: Diagnosis not present

## 2020-07-20 DIAGNOSIS — K640 First degree hemorrhoids: Secondary | ICD-10-CM | POA: Insufficient documentation

## 2020-07-20 DIAGNOSIS — Z79899 Other long term (current) drug therapy: Secondary | ICD-10-CM | POA: Insufficient documentation

## 2020-07-20 HISTORY — PX: COLONOSCOPY WITH PROPOFOL: SHX5780

## 2020-07-20 SURGERY — COLONOSCOPY WITH PROPOFOL
Anesthesia: General

## 2020-07-20 MED ORDER — SODIUM CHLORIDE 0.9 % IV SOLN: INTRAVENOUS | Status: DC

## 2020-07-20 MED ORDER — PROPOFOL 500 MG/50ML IV EMUL
INTRAVENOUS | Status: DC | PRN
  Administered 2020-07-20: 125 ug/kg/min via INTRAVENOUS

## 2020-07-20 MED ORDER — PROPOFOL 10 MG/ML IV BOLUS
INTRAVENOUS | Status: DC | PRN
  Administered 2020-07-20: 80 mg via INTRAVENOUS
  Administered 2020-07-20: 20 mg via INTRAVENOUS

## 2020-07-20 MED ORDER — PROPOFOL 10 MG/ML IV BOLUS
INTRAVENOUS | Status: AC
  Filled 2020-07-20: qty 20

## 2020-07-20 NOTE — Anesthesia Preprocedure Evaluation (Signed)
Anesthesia Evaluation  Patient identified by MRN, date of birth, ID band Patient awake    Reviewed: Allergy & Precautions, H&P , NPO status , Patient's Chart, lab work & pertinent test results, reviewed documented beta blocker date and time   Airway Mallampati: II   Neck ROM: full    Dental  (+) Poor Dentition   Pulmonary neg pulmonary ROS, asthma ,    Pulmonary exam normal        Cardiovascular + DVT  Normal cardiovascular exam Rhythm:regular Rate:Normal     Neuro/Psych  Neuromuscular disease negative psych ROS   GI/Hepatic negative GI ROS, Neg liver ROS, Bowel prep,  Endo/Other  negative endocrine ROS  Renal/GU negative Renal ROS  negative genitourinary   Musculoskeletal   Abdominal   Peds  Hematology negative hematology ROS (+)   Anesthesia Other Findings Past Medical History: No date: Allergic rhinitis No date: Asthma No date: B12 deficiency No date: Colon polyps No date: DVT (deep vein thrombosis) in pregnancy ([place]) No date: Laryngopharyngeal reflux (LPR) Past Surgical History: No date: COLONOSCOPY No date: FLEXIBLE SIGMOIDOSCOPY No date: HERNIA REPAIR No date: NASAL SINUS SURGERY BMI    Body Mass Index:  24.82 kg/m     Reproductive/Obstetrics negative OB ROS                             Anesthesia Physical  Anesthesia Plan  ASA: 2  Anesthesia Plan: General   Post-op Pain Management:    Induction: Intravenous  PONV Risk Score and Plan: 1 and Propofol infusion and TIVA  Airway Management Planned: Natural Airway and Nasal Cannula  Additional Equipment:   Intra-op Plan:   Post-operative Plan:   Informed Consent: I have reviewed the patients History and Physical, chart, labs and discussed the procedure including the risks, benefits and alternatives for the proposed anesthesia with the patient or authorized representative who has indicated his/her understanding and  acceptance.     Dental Advisory Given  Plan Discussed with: CRNA, Anesthesiologist and Surgeon  Anesthesia Plan Comments:         Anesthesia Quick Evaluation

## 2020-07-20 NOTE — Transfer of Care (Signed)
Immediate Anesthesia Transfer of Care Note  Patient: George Sharp  Procedure(s) Performed: COLONOSCOPY WITH PROPOFOL  Patient Location: PACU  Anesthesia Type:General  Level of Consciousness: awake and alert   Airway & Oxygen Therapy: Patient Spontanous Breathing and Patient connected to nasal cannula oxygen  Post-op Assessment: Report given to RN and Post -op Vital signs reviewed and stable  Post vital signs: Reviewed and stable  Last Vitals:  Vitals Value Taken Time  BP 97/60 07/20/20 1454  Temp    Pulse 63 07/20/20 1454  Resp 13 07/20/20 1454  SpO2 97 % 07/20/20 1454  Vitals shown include unvalidated device data.  Last Pain:  Vitals:   07/20/20 1351  TempSrc: Temporal  PainSc: 0-No pain         Complications: No notable events documented.

## 2020-07-20 NOTE — Op Note (Signed)
[facility] Gastroenterology Patient Name: George Sharp Procedure Date: 07/20/2020 2:28 PM MRN: [id number] Account #: [id number] Date of Birth: 06/04/1960 Admit Type: Outpatient Age: 60 Room: [facility] ENDO ROOM 2 Gender: Male Note Status: Finalized Procedure:             Colonoscopy Indications:           Surveillance: Personal history of adenomatous polyps                         on last colonoscopy > 3 years ago Providers:             Lorie Apley K. Zenas Santa MD, MD Medicines:             Propofol per Anesthesia Complications:         No immediate complications. Procedure:             Pre-Anesthesia Assessment:                        - The risks and benefits of the procedure and the                         sedation options and risks were discussed with the                         patient. All questions were answered and informed                         consent was obtained.                        - Patient identification and proposed procedure were                         verified prior to the procedure by the nurse. The                         procedure was verified in the procedure room.                        - ASA Grade Assessment: III - A patient with severe                         systemic disease.                        - After reviewing the risks and benefits, the patient                         was deemed in satisfactory condition to undergo the                         procedure.                        After obtaining informed consent, the colonoscope was                         passed under direct vision. Throughout the procedure,  the patient's blood pressure, pulse, and oxygen                         saturations were monitored continuously. The                         Colonoscope was introduced through the anus and                         advanced to the the cecum, identified by appendiceal                         orifice and ileocecal  valve. The colonoscopy was                         performed without difficulty. The patient tolerated                         the procedure well. The quality of the bowel                         preparation was good. The ileocecal valve, appendiceal                         orifice, and rectum were photographed. Findings:      The perianal and digital rectal examinations were normal. Pertinent       negatives include normal sphincter tone and no palpable rectal lesions.      Non-bleeding internal hemorrhoids were found during retroflexion. The       hemorrhoids were Grade I (internal hemorrhoids that do not prolapse).      Many small and large-mouthed diverticula were found in the entire colon.       There was no evidence of diverticular bleeding.      Two sessile polyps were found in the sigmoid colon and transverse colon.       The polyps were 4 to 6 mm in size. These polyps were removed with a       jumbo cold forceps. Resection and retrieval were complete.      The exam was otherwise without abnormality. Impression:            - Non-bleeding internal hemorrhoids.                        - Mild diverticulosis in the entire examined colon.                         There was no evidence of diverticular bleeding.                        - Two 4 to 6 mm polyps in the sigmoid colon and in the                         transverse colon, removed with a jumbo cold forceps.                         Resected and retrieved.                        -  The examination was otherwise normal. Recommendation:        - Patient has a contact number available for                         emergencies. The signs and symptoms of potential                         delayed complications were discussed with the patient.                         Return to normal activities tomorrow. Written                         discharge instructions were provided to the patient.                        - Resume previous diet.                         - Continue present medications.                        - Repeat colonoscopy is recommended for surveillance.                         The colonoscopy date will be determined after                         pathology results from today's exam become available                         for review.                        - Return to GI office PRN.                        - The findings and recommendations were discussed with                         the patient. Procedure Code(s):     --- Professional ---                        [phone], Colonoscopy, flexible; with biopsy, single or                         multiple Diagnosis Code(s):     --- Professional ---                        K57.30, Diverticulosis of large intestine without                         perforation or abscess without bleeding                        K63.5, Polyp of colon                        K64.0, First degree hemorrhoids  Z86.010, Personal history of colonic polyps CPT copyright 2019 American Medical Association. All rights reserved. The codes documented in this report are preliminary and upon coder review may  be revised to meet current compliance requirements. Efrain Sella MD, MD 07/20/2020 2:54:12 PM This report has been signed electronically. Number of Addenda: 0 Note Initiated On: 07/20/2020 2:28 PM Scope Withdrawal Time: 0 hours 9 minutes 52 seconds  Total Procedure Duration: 0 hours 12 minutes 50 seconds  Estimated Blood Loss:  Estimated blood loss: none.      [facility]

## 2020-07-20 NOTE — Progress Notes (Signed)
Per Dr Alice Reichert, patient may resume Coumadin today, July 20, 2020.  Noted on discharge summary and information given verbally to the patient.

## 2020-07-21 ENCOUNTER — Encounter: Payer: Self-pay | Admitting: Internal Medicine

## 2020-07-21 NOTE — H&P (Signed)
Outpatient short stay form Pre-procedure 07/21/2020 1:28 PM George Sharp K. Alice Reichert, M.D.  Primary Physician: Emily Filbert, M.D.  Reason for visit:  Personal history of adenomatous colon polyps  History of present illness:                            Patient presents for colonoscopy for a personal hx of colon polyps. The patient denies abdominal pain, abnormal weight loss or rectal bleeding.     No current facility-administered medications for this encounter.  Current Outpatient Medications:    acetaminophen (TYLENOL) 500 MG tablet, Take 500 mg by mouth every 6 (six) hours as needed., Disp: , Rfl:    cetirizine (ZYRTEC) 10 MG tablet, Take 10 mg by mouth daily., Disp: , Rfl:    Cholecalciferol 25 MCG (1000 UT) tablet, Take 1,000 Units by mouth daily., Disp: , Rfl:    montelukast (SINGULAIR) 10 MG tablet, , Disp: , Rfl:    Triamcinolone Acetonide (NASACORT AQ NA), Place 2 sprays into the nose daily., Disp: , Rfl:    cyclobenzaprine (FLEXERIL) 5 MG tablet, Take 5 mg by mouth 3 (three) times daily as needed., Disp: , Rfl:    predniSONE (DELTASONE) 10 MG tablet, Use as directed per doctors orders., Disp: 21 tablet, Rfl: 0   warfarin (COUMADIN) 10 MG tablet, Take 10 mg by mouth daily at 6 PM., Disp: , Rfl:   No medications prior to admission.     No Known Allergies   Past Medical History:  Diagnosis Date   Allergic rhinitis    Asthma    B12 deficiency    Colon polyps    DVT (deep vein thrombosis)    Laryngopharyngeal reflux (LPR)     Review of systems:  Otherwise negative.    Physical Exam  Gen: Alert, oriented. Appears stated age.  HEENT: Strum/AT. PERRLA. Lungs: CTA, no wheezes. CV: RR nl S1, S2. Abd: soft, benign, no masses. BS+ Ext: No edema. Pulses 2+    Planned procedures: Proceed with colonoscopy. The patient understands the nature of the planned procedure, indications, risks, alternatives and potential complications including but not limited to bleeding, infection,  perforation, damage to internal organs and possible oversedation/side effects from anesthesia. The patient agrees and gives consent to proceed.  Please refer to procedure notes for findings, recommendations and patient disposition/instructions.     [name] K. Alice Reichert, M.D. Gastroenterology 07/21/2020  1:28 PM

## 2020-07-21 NOTE — Interval H&P Note (Signed)
History and Physical Interval Note:  07/21/2020 1:32 PM  George Sharp  has presented today for surgery, with the diagnosis of history of adenomatous colonic polyps.  The various methods of treatment have been discussed with the patient and family. After consideration of risks, benefits and other options for treatment, the patient has consented to  Procedure(s): COLONOSCOPY WITH PROPOFOL (N/A) as a surgical intervention.  The patient's history has been reviewed, patient examined, no change in status, stable for surgery.  I have reviewed the patient's chart and labs.  Questions were answered to the patient's satisfaction.     [place], [place]

## 2020-07-21 NOTE — Anesthesia Postprocedure Evaluation (Signed)
Anesthesia Post Note  Patient: George Sharp  Procedure(s) Performed: COLONOSCOPY WITH PROPOFOL  Patient location during evaluation: Phase II Anesthesia Type: General Level of consciousness: awake and alert, awake and oriented Pain management: pain level controlled Vital Signs Assessment: post-procedure vital signs reviewed and stable Respiratory status: spontaneous breathing, nonlabored ventilation and respiratory function stable Cardiovascular status: blood pressure returned to baseline and stable Postop Assessment: no apparent nausea or vomiting Anesthetic complications: no   No notable events documented.   Last Vitals:  Vitals:   07/20/20 1504 07/20/20 1514  BP: 94/63 98/67  Pulse: 62 61  Resp: (!) 21 14  Temp:    SpO2: 98% 98%    Last Pain:  Vitals:   07/20/20 1514  TempSrc:   PainSc: 0-No pain                 Phill Mutter

## 2020-07-22 LAB — SURGICAL PATHOLOGY

## 2020-10-06 ENCOUNTER — Other Ambulatory Visit: Payer: Self-pay | Admitting: Internal Medicine

## 2020-10-06 DIAGNOSIS — R1011 Right upper quadrant pain: Secondary | ICD-10-CM

## 2020-10-06 DIAGNOSIS — R7401 Elevation of levels of liver transaminase levels: Secondary | ICD-10-CM

## 2020-10-18 ENCOUNTER — Ambulatory Visit: Admission: RE | Admit: 2020-10-18 | Payer: BC Managed Care – PPO | Source: Ambulatory Visit

## 2020-10-18 ENCOUNTER — Other Ambulatory Visit: Payer: Self-pay

## 2020-10-18 ENCOUNTER — Ambulatory Visit
Admission: RE | Admit: 2020-10-18 | Discharge: 2020-10-18 | Disposition: A | Discharge status: Home / Self Care | Payer: BC Managed Care – PPO | Source: Ambulatory Visit | Attending: Internal Medicine | Admitting: Internal Medicine

## 2020-10-18 DIAGNOSIS — R1011 Right upper quadrant pain: Secondary | ICD-10-CM

## 2020-10-18 DIAGNOSIS — R7401 Elevation of levels of liver transaminase levels: Secondary | ICD-10-CM | POA: Diagnosis present

## 2020-10-18 MED ORDER — IOHEXOL 350 MG/ML SOLN
100.0000 mL | Freq: Once | INTRAVENOUS | Status: AC | PRN
  Administered 2020-10-18: 100 mL via INTRAVENOUS

## 2020-10-25 ENCOUNTER — Ambulatory Visit: Admission: RE | Admit: 2020-10-25 | Payer: BC Managed Care – PPO | Source: Ambulatory Visit

## 2021-03-24 ENCOUNTER — Other Ambulatory Visit: Payer: Self-pay | Admitting: Internal Medicine

## 2021-03-24 DIAGNOSIS — M5412 Radiculopathy, cervical region: Secondary | ICD-10-CM

## 2021-04-02 ENCOUNTER — Ambulatory Visit
Admission: RE | Admit: 2021-04-02 | Discharge: 2021-04-02 | Disposition: A | Discharge status: Home / Self Care | Payer: BC Managed Care – PPO | Source: Ambulatory Visit | Attending: Internal Medicine | Admitting: Internal Medicine

## 2021-04-02 ENCOUNTER — Other Ambulatory Visit: Payer: Self-pay

## 2021-04-02 DIAGNOSIS — M4692 Unspecified inflammatory spondylopathy, cervical region: Secondary | ICD-10-CM | POA: Insufficient documentation

## 2021-04-02 DIAGNOSIS — M5412 Radiculopathy, cervical region: Secondary | ICD-10-CM | POA: Diagnosis present

## 2021-05-08 DIAGNOSIS — S43432A Superior glenoid labrum lesion of left shoulder, initial encounter: Secondary | ICD-10-CM | POA: Insufficient documentation

## 2021-05-10 ENCOUNTER — Other Ambulatory Visit: Payer: Self-pay | Admitting: Surgery

## 2021-05-10 DIAGNOSIS — M7582 Other shoulder lesions, left shoulder: Secondary | ICD-10-CM

## 2021-05-10 DIAGNOSIS — S43432A Superior glenoid labrum lesion of left shoulder, initial encounter: Secondary | ICD-10-CM

## 2021-05-31 ENCOUNTER — Other Ambulatory Visit: Payer: Self-pay | Admitting: Surgery

## 2021-06-02 ENCOUNTER — Other Ambulatory Visit
Admission: RE | Admit: 2021-06-02 | Discharge: 2021-06-02 | Disposition: A | Discharge status: Home / Self Care | Payer: BC Managed Care – PPO | Source: Ambulatory Visit | Attending: Surgery | Admitting: Surgery

## 2021-06-02 VITALS — Ht 73.0 in | Wt 180.0 lb

## 2021-06-02 DIAGNOSIS — I82629 Acute embolism and thrombosis of deep veins of unspecified upper extremity: Secondary | ICD-10-CM

## 2021-06-02 HISTORY — DX: Gastro-esophageal reflux disease without esophagitis: K21.9

## 2021-06-02 NOTE — Patient Instructions (Addendum)
?Your procedure is scheduled on: Thursday Jun 08, 2021. ?Report to Day Surgery inside [place] 2nd floor, stop by admissions desk before getting on elevator.  ?To find out your arrival time please call [phone] between 1PM - 3PM on Wednesday Jun 07, 2021. ? ?Remember: Instructions that are not followed completely may result in serious medical risk,  ?up to and including death, or upon the discretion of your surgeon and anesthesiologist your  ?surgery may need to be rescheduled.  ? ?  _X__ 1. Do not eat food after midnight the night before your procedure. ?                No chewing gum or hard candies. You may drink clear liquids up to 2 hours ?                before you are scheduled to arrive for your surgery- DO not drink clear ?                liquids within 2 hours of the start of your surgery. ?                Clear Liquids include:  water, apple juice without pulp, clear Gatorade, G2 or  ?                Gatorade Zero (avoid Red/Purple/Blue), Black Coffee or Tea (Do not add ?                anything to coffee or tea). ? ?__X__2.   Complete the "Ensure Clear Pre-surgery Clear Carbohydrate Drink" provided to you, 2 hours before arrival. **If you are diabetic you will be provided with an alternative drink, Gatorade Zero or G2. ? ?__X__3.  On the morning of surgery brush your teeth with toothpaste and water, you ?               may rinse your mouth with mouthwash if you wish.  Do not swallow any toothpaste or mouthwash. ?   ? _X__ 4.  No Alcohol for 24 hours before or after surgery. ? ? _X__ 5.  Do Not Smoke or use e-cigarettes For 24 Hours Prior to Your Surgery. ?                Do not use any chewable tobacco products for at least 6 hours prior to ?                Surgery. ? ?_X__  6.  Do not use any recreational drugs (marijuana, cocaine, heroin, ecstasy, MDMA or other) ?               For at least one week prior to your surgery.  Combination of these drugs with anesthesia ?                May have life threatening results. ? ?____  7.  Bring all medications with you on the day of surgery if instructed.  ? ?__X__8.  Notify your doctor if there is any change in your medical condition  ?    (cold, fever, infections). ?    ?Do not wear jewelry, make-up, hairpins, clips or nail polish. ?Do not wear lotions, powders, or perfumes or  deodorant. ?Do not shave 48 hours prior to surgery. Men may shave face and neck. ?Do not bring valuables to the hospital.   ? ?[facility] is not responsible for any belongings or valuables. ? ?Contacts, dentures  or bridgework may not be worn into surgery. ?Leave your suitcase in the car. After surgery it may be brought to your room. ?For patients admitted to the hospital, discharge time is determined by your ?treatment team. ?  ?Patients discharged the day of surgery will not be allowed to drive home.   ?Make arrangements for someone to be with you for the first 24 hours of your ?Same Day Discharge. ? ? ?__X__ Take these medicines the morning of surgery with A SIP OF WATER:  ? ? 1. omeprazole (PRILOSEC) 20 MG capsule (take one dose the night before and the day of surgery) ? 2.  ? 3.  ? 4. ? 5. ? 6. ? ?____ Fleet Enema (as directed)  ? ?__X__ Use CHG Soap (or wipes) as directed ? ?____ Use Benzoyl Peroxide Gel as instructed ? ?____ Use inhalers on the day of surgery ? ?____ Stop metformin 2 days prior to surgery   ? ?____ Take 1/2 of usual insulin dose the night before surgery. No insulin the morning ?         of surgery.  ? ?__X__ Stop Eliquis 5 mg Sunday 06/04/21 as instructed by your doctor.  ? ?__X__ One Week prior to surgery- Stop Anti-inflammatories such as Ibuprofen, Aleve, Advil, Motrin, meloxicam (MOBIC), diclofenac, etodolac, ketorolac, Toradol, Daypro, piroxicam, Goody's or BC powders. OK TO USE TYLENOL IF NEEDED ?  ?__X__ Stop supplements until after surgery.   ? ?____ Bring C-Pap to the hospital.  ? ? ?If you have any questions regarding your pre-procedure  instructions,  ?Please call Pre-admit Testing at [phone] ?

## 2021-06-05 ENCOUNTER — Other Ambulatory Visit: Payer: BC Managed Care – PPO

## 2021-06-07 ENCOUNTER — Encounter: Payer: Self-pay | Admitting: Urgent Care

## 2021-06-07 ENCOUNTER — Encounter
Admission: RE | Admit: 2021-06-07 | Discharge: 2021-06-07 | Disposition: A | Discharge status: Home / Self Care | Payer: BC Managed Care – PPO | Source: Ambulatory Visit | Attending: Surgery | Admitting: Surgery

## 2021-06-07 DIAGNOSIS — Z86718 Personal history of other venous thrombosis and embolism: Secondary | ICD-10-CM | POA: Diagnosis not present

## 2021-06-07 DIAGNOSIS — I82629 Acute embolism and thrombosis of deep veins of unspecified upper extremity: Secondary | ICD-10-CM

## 2021-06-07 DIAGNOSIS — S46012A Strain of muscle(s) and tendon(s) of the rotator cuff of left shoulder, initial encounter: Secondary | ICD-10-CM | POA: Diagnosis not present

## 2021-06-07 DIAGNOSIS — M19012 Primary osteoarthritis, left shoulder: Secondary | ICD-10-CM | POA: Diagnosis present

## 2021-06-07 DIAGNOSIS — M25812 Other specified joint disorders, left shoulder: Secondary | ICD-10-CM | POA: Diagnosis not present

## 2021-06-07 DIAGNOSIS — Z01818 Encounter for other preprocedural examination: Secondary | ICD-10-CM | POA: Insufficient documentation

## 2021-06-07 LAB — BASIC METABOLIC PANEL
Anion gap: 5 (ref 5–15)
BUN: 9 mg/dL (ref 8–23)
CO2: 28 mmol/L (ref 22–32)
Calcium: 9.2 mg/dL (ref 8.9–10.3)
Chloride: 107 mmol/L (ref 98–111)
Creatinine, Ser: 0.77 mg/dL (ref 0.61–1.24)
GFR, Estimated: >60 mL/min (ref >60)
Glucose, Bld: 95 mg/dL (ref 70–99)
Potassium: 3.9 mmol/L (ref 3.5–5.1)
Sodium: 140 mmol/L (ref 135–145)

## 2021-06-07 LAB — CBC
HCT: 44.4 % (ref 39.0–52.0)
Hemoglobin: 14.8 g/dL (ref 13.0–17.0)
MCH: 32.8 pg (ref 26.0–34.0)
MCHC: 33.3 g/dL (ref 30.0–36.0)
MCV: 98.4 fL (ref 80.0–100.0)
Platelets: 217 10*3/uL (ref 150–400)
RBC: 4.51 MIL/uL (ref 4.22–5.81)
RDW: 12.7 % (ref 11.5–15.5)
WBC: 5.4 10*3/uL (ref 4.0–10.5)
nRBC: 0 % (ref 0.0–0.2)

## 2021-06-07 NOTE — Anesthesia Preprocedure Evaluation (Addendum)
Anesthesia Evaluation  ?Patient identified by MRN, date of birth, ID band ?Patient awake ? ? ? ?Reviewed: ?Allergy & Precautions, H&P , NPO status , Patient's Chart, lab work & pertinent test results, reviewed documented beta blocker date and time  ? ?Airway ?Mallampati: III ? ? ?Neck ROM: full ? ? ? Dental ? ?(+) Poor Dentition ?  ?Pulmonary ?neg pulmonary ROS,  ?  ?Pulmonary exam normal ? ? ? ? ? ? ? Cardiovascular ?+ DVT (2004)  ?Normal cardiovascular exam ?Rhythm:regular Rate:Normal ? ? ?  ?Neuro/Psych ?negative neurological ROS ? negative psych ROS  ? GI/Hepatic ?Neg liver ROS, Bowel prep,GERD  Medicated and Controlled,  ?Endo/Other  ?negative endocrine ROS ? Renal/GU ?negative Renal ROS  ?negative genitourinary ?  ?Musculoskeletal ? ? Abdominal ?Normal abdominal exam  (+)   ?Peds ? Hematology ?negative hematology ROS ?(+)   ?Anesthesia Other Findings ?Past Medical History: ?No date: Allergic rhinitis ?No date: Asthma ?No date: B12 deficiency ?No date: Colon polyps ?No date: DVT (deep vein thrombosis) ?No date: Laryngopharyngeal reflux (LPR) ?Past Surgical History: ?No date: COLONOSCOPY ?No date: FLEXIBLE SIGMOIDOSCOPY ?No date: HERNIA REPAIR ?No date: NASAL SINUS SURGERY ?BMI   ? Body Mass Index:  24.82 kg/m?  ?  ? Reproductive/Obstetrics ?negative OB ROS ? ?  ? ? ? ? ? ? ? ? ? ? ? ? ? ?  ?  ? ? ? ? ? ? ? ?Anesthesia Physical ? ?Anesthesia Plan ? ?ASA: 2 ? ?Anesthesia Plan: General  ? ?Post-op Pain Management: Regional block* and Tylenol PO (pre-op)*  ? ?Induction: Intravenous ? ?PONV Risk Score and Plan: 1 and Ondansetron, Dexamethasone and Midazolam ? ?Airway Management Planned: Oral ETT ? ?Additional Equipment:  ? ?Intra-op Plan:  ? ?Post-operative Plan: Extubation in OR ? ?Informed Consent: I have reviewed the patients History and Physical, chart, labs and discussed the procedure including the risks, benefits and alternatives for the proposed anesthesia with the patient or  authorized representative who has indicated his/her understanding and acceptance.  ? ? ? ?Dental advisory given ? ?Plan Discussed with: CRNA, Anesthesiologist and Surgeon ? ?Anesthesia Plan Comments:   ? ? ? ? ? ?Anesthesia Quick Evaluation ? ?

## 2021-06-08 ENCOUNTER — Ambulatory Visit
Admission: RE | Admit: 2021-06-08 | Discharge: 2021-06-08 | Disposition: A | Discharge status: Home / Self Care | Payer: BC Managed Care – PPO | Attending: Surgery | Admitting: Surgery

## 2021-06-08 ENCOUNTER — Other Ambulatory Visit: Payer: Self-pay

## 2021-06-08 ENCOUNTER — Encounter: Payer: Self-pay | Admitting: Surgery

## 2021-06-08 ENCOUNTER — Ambulatory Visit: Payer: BC Managed Care – PPO | Admitting: Anesthesiology

## 2021-06-08 ENCOUNTER — Ambulatory Visit: Payer: BC Managed Care – PPO

## 2021-06-08 ENCOUNTER — Encounter
Admission: RE | Disposition: A | Discharge status: Home / Self Care | Payer: Self-pay | Source: Home / Self Care | Attending: Surgery

## 2021-06-08 DIAGNOSIS — M25812 Other specified joint disorders, left shoulder: Secondary | ICD-10-CM | POA: Insufficient documentation

## 2021-06-08 DIAGNOSIS — M19012 Primary osteoarthritis, left shoulder: Secondary | ICD-10-CM | POA: Diagnosis not present

## 2021-06-08 DIAGNOSIS — S46012A Strain of muscle(s) and tendon(s) of the rotator cuff of left shoulder, initial encounter: Secondary | ICD-10-CM | POA: Insufficient documentation

## 2021-06-08 DIAGNOSIS — Z86718 Personal history of other venous thrombosis and embolism: Secondary | ICD-10-CM | POA: Insufficient documentation

## 2021-06-08 HISTORY — PX: SHOULDER ARTHROSCOPY WITH SUBACROMIAL DECOMPRESSION, ROTATOR CUFF REPAIR AND BICEP TENDON REPAIR: SHX5687

## 2021-06-08 SURGERY — SHOULDER ARTHROSCOPY WITH SUBACROMIAL DECOMPRESSION, ROTATOR CUFF REPAIR AND BICEP TENDON REPAIR
Anesthesia: General | Site: Shoulder | Laterality: Left

## 2021-06-08 MED ORDER — OXYCODONE HCL 5 MG PO TABS: 5.0000 mg | ORAL_TABLET | Freq: Once | ORAL | Status: DC | PRN

## 2021-06-08 MED ORDER — DROPERIDOL 2.5 MG/ML IJ SOLN: 0.6250 mg | Freq: Once | INTRAMUSCULAR | Status: DC | PRN

## 2021-06-08 MED ORDER — MIDAZOLAM HCL 2 MG/2ML IJ SOLN
INTRAMUSCULAR | Status: AC
Start: 2021-06-08 — End: ?
  Filled 2021-06-08: qty 2

## 2021-06-08 MED ORDER — ACETAMINOPHEN 500 MG PO TABS
1000.0000 mg | ORAL_TABLET | Freq: Once | ORAL | Status: AC
Start: 2021-06-08 — End: 2021-06-08

## 2021-06-08 MED ORDER — ACETAMINOPHEN 500 MG PO TABS
ORAL_TABLET | ORAL | Status: AC
  Administered 2021-06-08: 1000 mg via ORAL
  Filled 2021-06-08: qty 2

## 2021-06-08 MED ORDER — MIDAZOLAM HCL 2 MG/2ML IJ SOLN
INTRAMUSCULAR | Status: DC | PRN
  Administered 2021-06-08: 2 mg via INTRAVENOUS

## 2021-06-08 MED ORDER — LACTATED RINGERS IV SOLN
INTRAVENOUS | Status: DC
Start: 2021-06-08 — End: 2021-06-08

## 2021-06-08 MED ORDER — BUPIVACAINE-EPINEPHRINE (PF) 0.5% -1:200000 IJ SOLN
INTRAMUSCULAR | Status: AC
  Filled 2021-06-08: qty 30

## 2021-06-08 MED ORDER — FENTANYL CITRATE PF 50 MCG/ML IJ SOSY: 50.0000 ug | PREFILLED_SYRINGE | INTRAMUSCULAR | Status: DC | PRN

## 2021-06-08 MED ORDER — LACTATED RINGERS IV SOLN
INTRAVENOUS | Status: DC | PRN
  Administered 2021-06-08 (×3): 3001 mL

## 2021-06-08 MED ORDER — OXYCODONE HCL 5 MG PO TABS: 5.0000 mg | ORAL_TABLET | ORAL | 0 refills | Status: DC | PRN

## 2021-06-08 MED ORDER — BUPIVACAINE LIPOSOME 1.3 % IJ SUSP
INTRAMUSCULAR | Status: DC | PRN
  Administered 2021-06-08: 10 mL via PERINEURAL

## 2021-06-08 MED ORDER — ONDANSETRON HCL 4 MG/2ML IJ SOLN
INTRAMUSCULAR | Status: DC | PRN
  Administered 2021-06-08: 4 mg via INTRAVENOUS

## 2021-06-08 MED ORDER — ROCURONIUM BROMIDE 10 MG/ML (PF) SYRINGE
PREFILLED_SYRINGE | INTRAVENOUS | Status: AC
  Filled 2021-06-08: qty 10

## 2021-06-08 MED ORDER — DEXAMETHASONE SODIUM PHOSPHATE 10 MG/ML IJ SOLN
INTRAMUSCULAR | Status: DC | PRN
Start: 2021-06-08 — End: 2021-06-08
  Administered 2021-06-08: 5 mg via INTRAVENOUS

## 2021-06-08 MED ORDER — METOCLOPRAMIDE HCL 5 MG/ML IJ SOLN: 5.0000 mg | Freq: Three times a day (TID) | INTRAMUSCULAR | Status: DC | PRN

## 2021-06-08 MED ORDER — METOCLOPRAMIDE HCL 10 MG PO TABS: 5.0000 mg | ORAL_TABLET | Freq: Three times a day (TID) | ORAL | Status: DC | PRN

## 2021-06-08 MED ORDER — FENTANYL CITRATE (PF) 100 MCG/2ML IJ SOLN
INTRAMUSCULAR | Status: AC
  Filled 2021-06-08: qty 2

## 2021-06-08 MED ORDER — PROPOFOL 10 MG/ML IV BOLUS
INTRAVENOUS | Status: DC | PRN
  Administered 2021-06-08: 160 mg via INTRAVENOUS

## 2021-06-08 MED ORDER — PHENYLEPHRINE 80 MCG/ML (10ML) SYRINGE FOR IV PUSH (FOR BLOOD PRESSURE SUPPORT)
PREFILLED_SYRINGE | INTRAVENOUS | Status: AC
  Filled 2021-06-08: qty 10

## 2021-06-08 MED ORDER — KETAMINE HCL 10 MG/ML IJ SOLN
INTRAMUSCULAR | Status: DC | PRN
  Administered 2021-06-08: 30 mg via INTRAVENOUS
  Administered 2021-06-08: 20 mg via INTRAVENOUS

## 2021-06-08 MED ORDER — BUPIVACAINE LIPOSOME 1.3 % IJ SUSP
INTRAMUSCULAR | Status: AC
  Filled 2021-06-08: qty 10

## 2021-06-08 MED ORDER — CHLORHEXIDINE GLUCONATE 0.12 % MT SOLN: 15.0000 mL | Freq: Once | OROMUCOSAL | Status: AC

## 2021-06-08 MED ORDER — ACETAMINOPHEN 10 MG/ML IV SOLN: 1000.0000 mg | Freq: Once | INTRAVENOUS | Status: DC | PRN

## 2021-06-08 MED ORDER — EPINEPHRINE PF 1 MG/ML IJ SOLN
INTRAMUSCULAR | Status: AC
  Filled 2021-06-08: qty 4

## 2021-06-08 MED ORDER — PHENYLEPHRINE 80 MCG/ML (10ML) SYRINGE FOR IV PUSH (FOR BLOOD PRESSURE SUPPORT)
PREFILLED_SYRINGE | INTRAVENOUS | Status: DC | PRN
  Administered 2021-06-08: 160 ug via INTRAVENOUS
  Administered 2021-06-08 (×3): 80 ug via INTRAVENOUS
  Administered 2021-06-08 (×2): 160 ug via INTRAVENOUS

## 2021-06-08 MED ORDER — ROCURONIUM BROMIDE 100 MG/10ML IV SOLN
INTRAVENOUS | Status: DC | PRN
  Administered 2021-06-08: 50 mg via INTRAVENOUS

## 2021-06-08 MED ORDER — CEFAZOLIN SODIUM-DEXTROSE 2-4 GM/100ML-% IV SOLN
INTRAVENOUS | Status: AC
  Filled 2021-06-08: qty 100

## 2021-06-08 MED ORDER — KETAMINE HCL 50 MG/5ML IJ SOSY
PREFILLED_SYRINGE | INTRAMUSCULAR | Status: AC
  Filled 2021-06-08: qty 5

## 2021-06-08 MED ORDER — ONDANSETRON HCL 4 MG PO TABS: 4.0000 mg | ORAL_TABLET | Freq: Four times a day (QID) | ORAL | Status: DC | PRN

## 2021-06-08 MED ORDER — KETOROLAC TROMETHAMINE 30 MG/ML IJ SOLN
INTRAMUSCULAR | Status: AC
  Filled 2021-06-08: qty 1

## 2021-06-08 MED ORDER — CEFAZOLIN SODIUM-DEXTROSE 2-4 GM/100ML-% IV SOLN
2.0000 g | INTRAVENOUS | Status: AC
  Administered 2021-06-08: 2 g via INTRAVENOUS

## 2021-06-08 MED ORDER — KETOROLAC TROMETHAMINE 30 MG/ML IJ SOLN
30.0000 mg | Freq: Once | INTRAMUSCULAR | Status: AC
Start: 2021-06-08 — End: 2021-06-08
  Administered 2021-06-08: 30 mg via INTRAVENOUS

## 2021-06-08 MED ORDER — LIDOCAINE HCL (PF) 1 % IJ SOLN
INTRAMUSCULAR | Status: AC
  Filled 2021-06-08: qty 5

## 2021-06-08 MED ORDER — SUGAMMADEX SODIUM 200 MG/2ML IV SOLN
INTRAVENOUS | Status: DC | PRN
Start: 2021-06-08 — End: 2021-06-08
  Administered 2021-06-08: 200 mg via INTRAVENOUS

## 2021-06-08 MED ORDER — DEXAMETHASONE SODIUM PHOSPHATE 10 MG/ML IJ SOLN
INTRAMUSCULAR | Status: AC
  Filled 2021-06-08: qty 1

## 2021-06-08 MED ORDER — MIDAZOLAM HCL 2 MG/2ML IJ SOLN
INTRAMUSCULAR | Status: AC
  Filled 2021-06-08: qty 2

## 2021-06-08 MED ORDER — LIDOCAINE HCL (CARDIAC) PF 100 MG/5ML IV SOSY
PREFILLED_SYRINGE | INTRAVENOUS | Status: DC | PRN
  Administered 2021-06-08: 100 mg via INTRAVENOUS

## 2021-06-08 MED ORDER — MIDAZOLAM HCL 2 MG/2ML IJ SOLN: 1.0000 mg | INTRAMUSCULAR | Status: DC | PRN

## 2021-06-08 MED ORDER — SODIUM CHLORIDE 0.9 % IV SOLN: INTRAVENOUS | Status: DC

## 2021-06-08 MED ORDER — PROPOFOL 10 MG/ML IV BOLUS
INTRAVENOUS | Status: AC
  Filled 2021-06-08: qty 20

## 2021-06-08 MED ORDER — OXYCODONE HCL 5 MG PO TABS: 5.0000 mg | ORAL_TABLET | ORAL | Status: DC | PRN

## 2021-06-08 MED ORDER — ONDANSETRON HCL 4 MG/2ML IJ SOLN
INTRAMUSCULAR | Status: AC
  Filled 2021-06-08: qty 2

## 2021-06-08 MED ORDER — OXYCODONE HCL 5 MG/5ML PO SOLN: 5.0000 mg | Freq: Once | ORAL | Status: DC | PRN

## 2021-06-08 MED ORDER — ONDANSETRON HCL 4 MG/2ML IJ SOLN: 4.0000 mg | Freq: Four times a day (QID) | INTRAMUSCULAR | Status: DC | PRN

## 2021-06-08 MED ORDER — BUPIVACAINE HCL (PF) 0.5 % IJ SOLN
INTRAMUSCULAR | Status: AC
  Filled 2021-06-08: qty 10

## 2021-06-08 MED ORDER — LIDOCAINE HCL (PF) 1 % IJ SOLN
INTRAMUSCULAR | Status: DC | PRN
  Administered 2021-06-08: 1 mL

## 2021-06-08 MED ORDER — FENTANYL CITRATE (PF) 100 MCG/2ML IJ SOLN: 25.0000 ug | INTRAMUSCULAR | Status: DC | PRN

## 2021-06-08 MED ORDER — PROMETHAZINE HCL 25 MG/ML IJ SOLN: 6.2500 mg | INTRAMUSCULAR | Status: DC | PRN

## 2021-06-08 MED ORDER — FENTANYL CITRATE PF 50 MCG/ML IJ SOSY
PREFILLED_SYRINGE | INTRAMUSCULAR | Status: AC
  Filled 2021-06-08: qty 1

## 2021-06-08 MED ORDER — CHLORHEXIDINE GLUCONATE 0.12 % MT SOLN
OROMUCOSAL | Status: AC
  Administered 2021-06-08: 15 mL via OROMUCOSAL
  Filled 2021-06-08: qty 15

## 2021-06-08 MED ORDER — FENTANYL CITRATE (PF) 100 MCG/2ML IJ SOLN
INTRAMUSCULAR | Status: DC | PRN
  Administered 2021-06-08 (×2): 50 ug via INTRAVENOUS

## 2021-06-08 MED ORDER — SUCCINYLCHOLINE CHLORIDE 200 MG/10ML IV SOSY
PREFILLED_SYRINGE | INTRAVENOUS | Status: AC
  Filled 2021-06-08: qty 10

## 2021-06-08 MED ORDER — ORAL CARE MOUTH RINSE: 15.0000 mL | Freq: Once | OROMUCOSAL | Status: AC

## 2021-06-08 MED ORDER — BUPIVACAINE-EPINEPHRINE 0.5% -1:200000 IJ SOLN
INTRAMUSCULAR | Status: DC | PRN
  Administered 2021-06-08: 30 mL

## 2021-06-08 MED ORDER — BUPIVACAINE HCL (PF) 0.5 % IJ SOLN
INTRAMUSCULAR | Status: DC | PRN
  Administered 2021-06-08: 10 mL via PERINEURAL

## 2021-06-08 SURGICAL SUPPLY — 61 items
ANCH SUT 2 JK 1.5X2.9 2 LD (Anchor) ×1 IMPLANT
ANCH SUT BN ASCP DLV (Anchor) ×1 IMPLANT
ANCH SUT RGNRT REGENETEN (Staple) ×1 IMPLANT
ANCHOR BONE REGENETEN (Anchor) ×1 IMPLANT
ANCHOR HEALICOIL REGEN 5.5 (Anchor) IMPLANT
ANCHOR JUGGERKNOT WTAP NDL 2.9 (Anchor) IMPLANT
ANCHOR SUT JK SZ 2 2.9 DBL SL (Anchor) ×1 IMPLANT
ANCHOR SUT QUATTRO KNTLS 4.5 (Anchor) IMPLANT
ANCHOR SUT W/ ORTHOCORD (Anchor) IMPLANT
ANCHOR TENDON REGENETEN (Staple) ×1 IMPLANT
APL PRP STRL LF DISP 70% ISPRP (MISCELLANEOUS) ×1
BIT DRILL JUGRKNT W/NDL BIT2.9 (DRILL) IMPLANT
BLADE FULL RADIUS 3.5 (BLADE) ×2 IMPLANT
BUR ACROMIONIZER 4.0 (BURR) ×2 IMPLANT
BUR BR 5.5 WIDE MOUTH (BURR) ×1 IMPLANT
CANNULA SHAVER 8MMX76MM (CANNULA) ×1 IMPLANT
CHLORAPREP W/TINT 26 (MISCELLANEOUS) ×2 IMPLANT
COVER MAYO STAND REUSABLE (DRAPES) ×2 IMPLANT
DILATOR 5.5 THREADED HEALICOIL (MISCELLANEOUS) IMPLANT
DRILL JUGGERKNOT W/NDL BIT 2.9 (DRILL) ×2
ELECT CAUTERY BLADE 6.4 (BLADE) ×2 IMPLANT
ELECT REM PT RETURN 9FT ADLT (ELECTROSURGICAL) ×2
ELECTRODE REM PT RTRN 9FT ADLT (ELECTROSURGICAL) ×1 IMPLANT
GAUZE SPONGE 4X4 12PLY STRL (GAUZE/BANDAGES/DRESSINGS) ×2 IMPLANT
GAUZE XEROFORM 1X8 LF (GAUZE/BANDAGES/DRESSINGS) ×2 IMPLANT
GLOVE BIOGEL PI IND STRL 8 (GLOVE) ×1 IMPLANT
GLOVE BIOGEL PI INDICATOR 8 (GLOVE) ×1
GLOVE SURG ENC MOIS LTX SZ7.5 (GLOVE) ×4 IMPLANT
GLOVE SURG ENC MOIS LTX SZ8 (GLOVE) ×4 IMPLANT
GLOVE SURG UNDER LTX SZ8 (GLOVE) ×2 IMPLANT
GOWN STRL REUS W/ TWL LRG LVL3 (GOWN DISPOSABLE) ×1 IMPLANT
GOWN STRL REUS W/ TWL XL LVL3 (GOWN DISPOSABLE) ×1 IMPLANT
GOWN STRL REUS W/TWL LRG LVL3 (GOWN DISPOSABLE) ×2
GOWN STRL REUS W/TWL XL LVL3 (GOWN DISPOSABLE) ×2
GRASPER SUT 15 45D LOW PRO (SUTURE) IMPLANT
IMPL REGENETEN MEDIUM (Shoulder) IMPLANT
IMPLANT REGENETEN MEDIUM (Shoulder) ×2 IMPLANT
IV LACTATED RINGER IRRG 3000ML (IV SOLUTION) ×4
IV LR IRRIG 3000ML ARTHROMATIC (IV SOLUTION) ×2 IMPLANT
KIT CANNULA 8X76-LX IN CANNULA (CANNULA) ×1 IMPLANT
MANIFOLD NEPTUNE II (INSTRUMENTS) ×4 IMPLANT
MASK FACE SPIDER DISP (MASK) ×2 IMPLANT
MAT ABSORB  FLUID 56X50 GRAY (MISCELLANEOUS) ×1
MAT ABSORB FLUID 56X50 GRAY (MISCELLANEOUS) ×1 IMPLANT
PACK ARTHROSCOPY SHOULDER (MISCELLANEOUS) ×2 IMPLANT
PAD ABD DERMACEA PRESS 5X9 (GAUZE/BANDAGES/DRESSINGS) ×4 IMPLANT
PASSER SUT FIRSTPASS SELF (INSTRUMENTS) IMPLANT
SLING ARM LRG DEEP (SOFTGOODS) ×1 IMPLANT
SLING ULTRA II LG (MISCELLANEOUS) ×2 IMPLANT
SPONGE T-LAP 18X18 ~~LOC~~+RFID (SPONGE) ×2 IMPLANT
STAPLER SKIN PROX 35W (STAPLE) ×2 IMPLANT
STRAP SAFETY 5IN WIDE (MISCELLANEOUS) ×2 IMPLANT
SUT ETHIBOND 0 MO6 C/R (SUTURE) ×2 IMPLANT
SUT ULTRABRAID 2 COBRAID 38 (SUTURE) IMPLANT
SUT VIC AB 2-0 CT1 27 (SUTURE) ×4
SUT VIC AB 2-0 CT1 TAPERPNT 27 (SUTURE) ×2 IMPLANT
TAPE MICROFOAM 4IN (TAPE) ×2 IMPLANT
TUBING CONNECTING 10 (TUBING) ×2 IMPLANT
TUBING INFLOW SET DBFLO PUMP (TUBING) ×2 IMPLANT
WAND WEREWOLF FLOW 90D (MISCELLANEOUS) ×2 IMPLANT
WATER STERILE IRR 500ML POUR (IV SOLUTION) ×2 IMPLANT

## 2021-06-08 NOTE — Anesthesia Procedure Notes (Signed)
Procedure Name: Intubation ?Date/Time: 06/08/2021 7:40 AM ?Performed by: Fredderick Phenix, CRNA ?Pre-anesthesia Checklist: Patient identified, Emergency Drugs available, Suction available and Patient being monitored ?Patient Re-evaluated:Patient Re-evaluated prior to induction ?Oxygen Delivery Method: Circle system utilized ?Preoxygenation: Pre-oxygenation with 100% oxygen ?Induction Type: IV induction ?Ventilation: Mask ventilation without difficulty ?Laryngoscope Size: 4 and McGraph ?Grade View: Grade II ?Tube type: Oral ?Tube size: 7.5 mm ?Number of attempts: 3 (2 unsucessful attempts by [place] Denesia Donelan and Dr. Wynetta Emery with DL. Third successful attempt by [place] Ludmila Ebarb with [facility]as Lav) ?Airway Equipment and Method: Stylet and Oral airway ?Placement Confirmation: ETT inserted through vocal cords under direct vision, positive ETCO2 and breath sounds checked- equal and bilateral ?Tube secured with: Tape ?Dental Injury: Teeth and Oropharynx as per pre-operative assessment  ? ? ? ? ?

## 2021-06-08 NOTE — Anesthesia Postprocedure Evaluation (Signed)
Anesthesia Post Note ? ?Patient: GILDO CRISCO ? ?Procedure(s) Performed: LEFT SHOULDER ARTHROSCOPY WITH DEBRIDEMENT, DECOMPRESSION, DISTAL CLAVICLE EXCISION, ROTATOR CUFF REPAIR, AND BICEPS TENODESIS (Left: Shoulder) ? ?Patient location during evaluation: PACU ?Anesthesia Type: General ?Level of consciousness: awake and alert ?Pain management: pain level controlled ?Vital Signs Assessment: post-procedure vital signs reviewed and stable ?Respiratory status: spontaneous breathing, nonlabored ventilation and respiratory function stable ?Cardiovascular status: blood pressure returned to baseline and stable ?Postop Assessment: no apparent nausea or vomiting ?Anesthetic complications: no ? ? ?No notable events documented. ? ? ?Last Vitals:  ?Vitals:  ? 06/08/21 1030 06/08/21 1043  ?BP: 112/79 117/76  ?Pulse: 89 86  ?Resp: 16 18  ?Temp: (!) 36.3 ?C 36.6 ?C  ?SpO2: 96% 99%  ?  ?Last Pain:  ?Vitals:  ? 06/08/21 1043  ?TempSrc: Temporal  ?PainSc: 0-No pain  ? ? ?  ?  ?  ?  ?  ?  ? ?Iran Ouch ? ? ? ? ?

## 2021-06-08 NOTE — Op Note (Signed)
06/08/2021 ? ?9:50 AM ? ?Patient:   George Sharp ? ?Pre-Op Diagnosis:   Impingement/tendinopathy with partial-thickness rotator cuff tear, type II SLAP tear, and DJD of AC joint, left shoulder. ? ?Post-Op Diagnosis:   Impingement/tendinopathy with partial-thickness rotator cuff tear, type II SLAP tear, DJD of AC joint, and biceps tendinopathy, left shoulder. ? ?Procedure:   Extensive arthroscopic debridement, arthroscopic subacromial decompression, arthroscopic excision of the distal clavicle, mini-open rotator cuff repair using a [employer] Regeneten patch, and mini-open biceps tenodesis, left shoulder. ? ?Anesthesia:   General endotracheal with interscalene block using Exparel placed preoperatively by the anesthesiologist. ? ?Surgeon:   Pascal Lux, MD ? ?Assistant:   Cameron Proud, PA-C; Myra Rude, PA-S ? ?Findings:   As above. There was a large stable SLAP tear extending from the 11:00 to the 2 o'clock position. The biceps tendon demonstrated moderate inflammatory changes without partial or full-thickness tearing. The rotator cuff demonstrated articular sided partial-thickness tearing involving the mid insertional fibers of the supraspinatus tendon involving approximately 30% of the footprint. The remainder of the rotator cuff was in satisfactory condition, as were the articular surfaces of both the glenoid and humerus. ? ?Complications:   None ? ?Fluids:   1000 cc ? ?Estimated blood loss:   10 cc ? ?Tourniquet time:   None ? ?Drains:   None ? ?Closure:   Staples     ? ?Brief clinical note:   The patient is a 61 year old male with a history of progressively worsening left shoulder pain. The patient's symptoms have progressed despite medications, activity modification, etc. The patient's history and examination are consistent with impingement/tendinopathy with a rotator cuff tear. These findings were confirmed by MRI scan. The patient presents at this time for definitive management of these shoulder  symptoms. ? ?Procedure:   The patient underwent placement of an interscalene block using Exparel by the anesthesiologist in the preoperative holding area before being brought into the operating room and lain in the supine position. The patient then underwent general endotracheal intubation and anesthesia before being repositioned in the beach chair position using the beach chair positioner. The left shoulder and upper extremity were prepped with ChloraPrep solution before being draped sterilely. Preoperative antibiotics were administered. A timeout was performed to confirm the proper surgical site before the expected portal sites and incision site were injected with 0.5% Sensorcaine with epinephrine.  ? ?A posterior portal was created and the glenohumeral joint thoroughly inspected with the findings as described above. An anterior portal was created using an outside-in technique. The labrum and rotator cuff were further probed, again confirming the above-noted findings. The areas of labral fraying were debrided back to stable margins using the full-radius resector, as were areas of synovitis anteriorly and superiorly. In addition, the torn portion of the articular sided partial-thickness tear involving the supraspinatus tendon also was debrided back to stable margins using the full-radius resector. The ArthroCare wand was inserted and used to release the biceps tendon from its labral anchor. It also was used to obtain hemostasis as well as to "anneal" the labrum superiorly and anteriorly. The superior labrum was carefully probed. Although there was clear separation of the labrum from the glenoid rim, there was no evidence for labral instability. Therefore, it was elected not to perform a formal repair. The instruments were removed from the joint after suctioning the excess fluid. ? ?The camera was repositioned through the posterior portal into the subacromial space. A separate lateral portal was created using an  outside-in technique. The 3.5 mm full-radius resector was introduced and used to perform a subtotal bursectomy. The ArthroCare wand was then inserted and used to remove the periosteal tissue off the undersurface of the anterior third of the acromion as well as to recess the coracoacromial ligament from its attachment along the anterior and lateral margins of the acromion. The 4.0 mm acromionizing bur was introduced and used to complete the decompression by removing the undersurface of the anterior third of the acromion.  ? ?The ArthroCare wand was used to denude the undersurface of the [facility] joint as well as the anterior, inferior, and posterior inferior portions of the distal clavicle. The acromionizing bur was then advanced across the inferior portion of the [facility] joint to remove the undersurface of the distal clavicle. The camera was repositioned through the lateral portal and first the 4 mm and then the 5.5 mm acromionizing bur was inserted and used to remove the lateral 7 to 8 mm of the distal clavicle. The camera was reintroduced through the anterior portal to better assess the adequacy of distal clavicle excision. Once this was deemed satisfactory, the full radius resector was reintroduced to remove any residual bony debris before the ArthroCare wand was reintroduced to obtain hemostasis. The instruments were then removed from the subacromial space after suctioning the excess fluid. ? ?An approximately 4-5 cm incision was made over the anterolateral aspect of the shoulder beginning at the anterolateral corner of the acromion and extending distally in line with the bicipital groove. This incision was carried down through the subcutaneous tissues to expose the deltoid fascia. The raphae between the anterior and middle thirds was identified and this plane developed to provide access into the subacromial space. Additional bursal tissues were debrided sharply using Metzenbaum scissors. The rotator cuff appeared to be  intact on its bursal surface. However, there was clear thinning of the mid insertional fibers of the supraspinatus tendon to palpation.  Given that there was at most 30% footprint compromise based on the intra-articular view, it was felt best to apply a [place] patch over the supraspinatus tendon to stimulate healing rather than to complete the tear and repair it. ? ?The bicipital groove was identified by palpation and opened for 1-1.5 cm. The biceps tendon stump was retrieved through this defect. The floor of the bicipital groove was roughened with a curet before another Biomet 2.9 mm JuggerKnot anchor was inserted. Both sets of sutures were passed through the biceps tendon and tied securely to effect the tenodesis. The bicipital sheath was reapproximated using two #0 Ethibond interrupted sutures, incorporating the biceps tendon to further reinforce the tenodesis. ? ?The wound was copiously irrigated with sterile saline solution before the deltoid raphae was reapproximated using 2-0 Vicryl interrupted sutures. The subcutaneous tissues were closed in two layers using 2-0 Vicryl interrupted sutures before the skin was closed using staples. The portal sites also were closed using staples. A sterile bulky dressing was applied to the shoulder before the arm was placed into a shoulder immobilizer. The patient was then awakened, extubated, and returned to the recovery room in satisfactory condition after tolerating the procedure well. ?

## 2021-06-08 NOTE — Anesthesia Procedure Notes (Signed)
Anesthesia Regional Block: Interscalene brachial plexus block  ? ?Pre-Anesthetic Checklist: , timeout performed,  Correct Patient, Correct Site, Correct Laterality,  Correct Procedure, Correct Position, site marked,  Risks and benefits discussed,  Surgical consent,  Pre-op evaluation,  At surgeon's request and post-op pain management ? ?Laterality: Upper and Left ? ?Prep: chloraprep     ?  ?Needles:  ?Injection technique: Single-shot ? ?Needle Type: Stimiplex   ? ? ?Needle Length: 9cm  ?Needle Gauge: 22  ? ? ? ?Additional Needles: ? ? ?Procedures:,,,, ultrasound used (permanent image in chart),,    ?Narrative:  ?Injection made incrementally with aspirations every 20 mL. ? ?Events: injection painful ? ?Performed by: Personally  ?Anesthesiologist: [place] Ouch, MD ? ?Additional Notes: ?Patient consented for risk and benefits of nerve block including but not limited to nerve damage, failed block, bleeding and infection.  Patient voiced understanding. ? ?Functioning IV was confirmed and monitors were applied.  Timeout done prior to procedure and prior to any sedation being given to the patient.  Patient confirmed procedure site prior to any sedation given to the patient.  A 41m 22ga Stimuplex needle was used. Sterile prep,hand hygiene and sterile gloves were used.  Minimal sedation used for procedure.  Intermittent paresthesia endorsed by patient during the procedure that improved.  Negative aspiration and negative test dose prior to incremental administration of local anesthetic. The patient tolerated the procedure well with no immediate complications. ? ? ? ?

## 2021-06-08 NOTE — H&P (Signed)
History of Present Illness:  ?George Sharp is a 61 y.o. male who presents for follow-up of his left shoulder pain secondary to impingement/tendinopathy with a probable SLAP tear. Since his last visit 3 weeks ago, he continues to experience moderate pain in his shoulder which he rates a 5/10 on today's visit. He continues to experience pain with overhead activities as well as when reaching behind his back. He also has pain at night. He denies any recent reinjury to the shoulder, and denies any numbness or paresthesias down his arm to his hand. Since his last visit, he has undergone an MRI scan of the left shoulder and presents today to review these results. ? ?Current Outpatient Medications: ? cetirizine (ZYRTEC) 10 MG tablet Take 10 mg by mouth once daily  ? cholecalciferol (VITAMIN D3) 1000 unit tablet Take 1 tablet (1,000 Units total) by mouth once a week  ? cyanocobalamin (VITAMIN B12) 1000 MCG tablet Take 1 tablet (1,000 mcg total) by mouth once a week  ? ELIQUIS 5 mg tablet TAKE ONE TABLET BY MOUTH EVERY 12 HOURS 60 tablet 5  ? omeprazole (PRILOSEC) 20 MG DR capsule Take 1 capsule (20 mg total) by mouth 2 (two) times daily 180 capsule 3  ? ?Allergies: ?No Known Allergies ? ?Past Medical History:  ? AR (allergic rhinitis) 06/09/2013  ? Asthma without status asthmaticus  ? 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 ?Past Surgical History:  ? FUNCTIONAL ENDOSCOPIC SINUS SURGERY 2008  ? COLONOSCOPY 12/07/2016  ?Dr. 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 FRACTURE SURGERY 2019  ? COLONOSCOPY 07/20/2020  ?Tubular adenoma/Hyperplastic polyp/PHx CP/Repeat 54yr/TKT  ? 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 FLEXIBLE SIGMOIDOSCOPY 03/29/1997, 09/27/1993, 08/15/1993, 07/28/1993  ? HERNIA REPAIR  ? ?Family History:  ? No Known Problems Mother  ? Stroke Father  ? Lung cancer Brother  ? Aneurysm Brother  ?  Cancer Brother  ? Myocardial Infarction (Heart attack) Brother  ? ?Social History:  ? ?Socioeconomic History:  ? Marital status: Married  ?Tobacco Use  ? Smoking status: Former  ?Types: Cigarettes  ?Quit date: 132 ?Years since quitting: 41.3  ? Smokeless tobacco: Never  ?Vaping Use  ? Vaping Use: Never used  ?Substance and Sexual Activity  ? Alcohol use: Yes  ?Comment: 1-4 a week  ? Drug use: No  ? Sexual activity: Yes  ? ?Review of Systems:  ?A comprehensive 14 point ROS was performed, reviewed, and the pertinent orthopaedic findings are documented in the HPI. ? ?Physical Exam: ?Vitals:  ?05/29/21 0913  ?BP: 116/78  ?Weight: 82.3 kg (181 lb 6.4 oz)  ?Height: 185.4 cm ('6\' 1"'$ )  ?PainSc: 5  ?PainLoc: Shoulder  ? ?General/Constitutional: The patient appears to be well-nourished, well-developed, and in no acute distress. ?Neuro/Psych: Normal mood and affect, oriented to person, place and time. ?Eyes: Non-icteric. Pupils are equal, round, and reactive to light, and exhibit synchronous movement. ?ENT: Unremarkable. ?Lymphatic: No palpable adenopathy. ?Respiratory: Lungs clear to auscultation, Normal chest excursion, No wheezes and Non-labored breathing ?Cardiovascular: Regular rate and rhythm. No murmurs. and No edema, swelling or tenderness, except as noted in detailed exam. ?Integumentary: No impressive skin lesions present, except as noted in detailed exam. ?Musculoskeletal: Unremarkable, except as noted in detailed exam. ? ?Left shoulder exam: ?SKIN: normal ?SWELLING: none ?WARMTH: none ?LYMPH NODES: no adenopathy palpable ?CREPITUS: none ?TENDERNESS: Minimally tender along lateral acromion ?ROM (active):  ?  Forward flexion: 165 degrees ?Abduction: 160 degrees ?Internal rotation: L1 ?ROM (passive):  ?Forward flexion: 170 degrees ?Abduction: 165 degrees ?ER/IR at 90 abd: 85 degrees/60 degrees ?  ?He experiences moderate pain with external rotation at 60, 90, and 120 degrees of abduction, and mild discomfort with at  the extremes of forward flexion and abduction. ?  ?STRENGTH: Forward flexion: 5/5 ?Abduction: 5/5 ?External rotation: 5/5 ?Internal rotation: Normal ?Pain with RC testing: Mild discomfort with resisted abduction ?  ?STABILITY: Normal ?  ?SPECIAL TESTS: Luan Pulling' test: Minimally positive ?Speed's test: negative ?Capsulitis - pain w/ passive ER: no ?Crossed arm test: no ?Crank: Not evaluated ?Anterior apprehension: Negative ?Posterior apprehension: Negative ? ?He is neurovascularly intact to the left upper extremity and hand. ? ?X-rays/MRI/Lab data:  ?A recent MRI scan of the left shoulder is available for review and has been reviewed by myself. By report, the study demonstrates evidence of partial-thickness tearing involving the articular insertional fibers of the supraspinatus tendon, as well as a substantial SLAP tear extending from approximately the 10:30 or 11 o'clock position anteriorly to the 3 or 3:30 position. In addition, there is evidence of advanced degenerative changes of the [facility] joint with impingement upon the underlying muscle tendon junction of the supraspinatus tendon. Both the films and report were reviewed by myself and discussed with the patient. ? ?Assessment: ? Superior labrum anterior-to-posterior (SLAP) tear of left shoulder.  ? Rotator cuff tendinitis, left  ? Nontraumatic incomplete tear of left rotator cuff.  ? 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 ? ?Plan: ?The treatment options were discussed with the patient. In addition, patient educational materials were provided regarding the diagnosis and treatment options. The patient is quite frustrated by his symptoms and functional limitations, and is ready to consider more aggressive treatment options. Therefore, I have recommended a surgical procedure, specifically a left shoulder arthroscopy with debridement, decompression, rotator cuff repair, SLAP repair, distal clavicle excision, and probable biceps tenodesis. The procedure was  discussed with the patient, as were the potential risks (including bleeding, infection, nerve and/or blood vessel injury, persistent or recurrent pain, failure of the repair, progression of arthritis, need for further surgery, blood clots, strokes, heart attacks and/or arhythmias, pneumonia, etc.) and benefits. The patient states his understanding and wishes to proceed. All of the patient's questions and concerns were answered. He can call any time with further concerns. He will follow up post-surgery, routine.  ? ? ?H&P reviewed and patient re-examined. No changes. ? ?

## 2021-06-08 NOTE — Transfer of Care (Signed)
Immediate Anesthesia Transfer of Care Note ? ?Patient: George Sharp ? ?Procedure(s) Performed: LEFT SHOULDER ARTHROSCOPY WITH DEBRIDEMENT, DECOMPRESSION, DISTAL CLAVICLE EXCISION, ROTATOR CUFF REPAIR, AND BICEPS TENODESIS (Left: Shoulder) ? ?Patient Location: PACU ? ?Anesthesia Type:General ? ?Level of Consciousness: drowsy ? ?Airway & Oxygen Therapy: Patient Spontanous Breathing and Patient connected to face mask oxygen ? ?Post-op Assessment: Report given to RN and Post -op Vital signs reviewed and stable ? ?Post vital signs: Reviewed and stable ? ?Last Vitals:  ?Vitals Value Taken Time  ?BP 113/69 06/08/21 1000  ?Temp    ?Pulse 81 06/08/21 1003  ?Resp 24 06/08/21 1003  ?SpO2 97 % 06/08/21 1003  ?Vitals shown include unvalidated device data. ? ?Last Pain:  ?Vitals:  ? 06/08/21 0629  ?TempSrc: Temporal  ?PainSc: 2   ?   ? ?  ? ?Complications: No notable events documented. ?

## 2021-06-08 NOTE — Discharge Instructions (Addendum)
Orthopedic discharge instructions: ?Keep dressing dry and intact.  ?May shower after dressing changed on post-op day #4 (Monday).  ?Cover staples with Band-Aids after drying off. ?Apply ice frequently to shoulder. ?Resume Eliquis tomorrow morning. ?Take oxycodone as prescribed when needed.  ?May supplement with ES Tylenol if necessary. ?Keep shoulder immobilizer on at all times except may remove for bathing purposes. ?Follow-up in 10-14 days or as scheduled. ? ? ?AMBULATORY SURGERY  ?DISCHARGE INSTRUCTIONS ? ? ?The drugs that you were given will stay in your system until tomorrow so for the next 24 hours you should not: ? ?Drive an automobile ?Make any legal decisions ?Drink any alcoholic beverage ? ? ?You may resume regular meals tomorrow.  Today it is better to start with liquids and gradually work up to solid foods. ? ?You may eat anything you prefer, but it is better to start with liquids, then soup and crackers, and gradually work up to solid foods. ? ? ?Please notify your doctor immediately if you have any unusual bleeding, trouble breathing, redness and pain at the surgery site, drainage, fever, or pain not relieved by medication. ? ? ? ?Additional Instructions: ? ? ? ? ? ? ? ?Please contact your physician with any problems or Same Day Surgery at [phone], Monday through Friday 6 am to 4 pm, or [place] [facility] number at [phone]. Interscalene Nerve Block (ISNB) Discharge Instructions  ? ? For your surgery you have received an Interscalene Nerve Block. ?Nerve Blocks affect many types of nerves, including nerves that control movement, pain and normal sensation.  You may experience feelings such as numbness, tingling, heaviness, weakness or the inability to move your arm or the feeling or sensation that your arm has "fallen asleep". ?A nerve block can last for 2 - 36 hours or more depending on the medication used.  Usually the weakness wears off first.  The tingling and heaviness usually  wear off next.  Finally you may start to notice pain.  Keep in mind that this may occur in any order.  once a nerve block starts to wear off it is usually completely gone within 60 minutes. ?ISNB may cause mild shortness of breath, a hoarse voice, blurry vision, unequal pupils, or drooping of the face on the same side as the nerve block.  These symptoms will usually go away within 12 hours.  Very rarely the procedure itself can cause mild seizures. ?If needed, your surgeon will give you a prescription for pain medication.  It will take about 60 minutes for the oral pain medication to become fully effective.  So, it is recommended that you start taking this medication before the nerve block first begins to wear off, or when you first begin to feel discomfort. ?Keep in mind that nerve blocks often wear off in the middle of the night.   If you are going to bed and the block has not started to wear off or you have not started to have any discomfort, consider setting an alarm for 2 to 3 hours, so you can assess your block.  If you notice the block is wearing off or you are starting to have discomfort, you can take your pain medication. ?Take your pain medication only as prescribed.  Pain medication can cause sedation and decrease your breathing if you take more than you need for the level of pain that you have. ?Nausea is a common side effect of many pain medications.  You may want to eat something before  taking your pain medicine to prevent nausea. ?After an Interscalene nerve block, you cannot feel pain, pressure or extremes in temperature in the effected arm.  Because your arm is numb it is at an increased risk for injury.  To decrease the possibility of injury, please practice the following: ? ?While you are awake change the position of your arm frequently to prevent too much pressure on any one area for prolonged periods of time. ? If you have a cast or tight dressing, check the color or your fingers every couple of  hours.  Call your surgeon with the appearance of any discoloration ([place] or blue). ?If you are given a sling to wear before you go home, please wear it  at all times until the block has completely worn off.  Do not get up at night without your sling. ?If you experience any problems or concerns, please contact your surgeon's office. ?If you experience severe or prolonged shortness of breath go to the nearest emergency department.  ? [email or web address] ? ?

## 2022-03-22 DIAGNOSIS — K7581 Nonalcoholic steatohepatitis (NASH): Secondary | ICD-10-CM | POA: Insufficient documentation

## 2022-06-22 ENCOUNTER — Encounter: Payer: Self-pay | Admitting: Podiatry

## 2022-06-22 ENCOUNTER — Ambulatory Visit: Payer: BC Managed Care – PPO | Admitting: Podiatry

## 2022-06-22 ENCOUNTER — Ambulatory Visit (INDEPENDENT_AMBULATORY_CARE_PROVIDER_SITE_OTHER): Payer: BC Managed Care – PPO

## 2022-06-22 DIAGNOSIS — M7752 Other enthesopathy of left foot: Secondary | ICD-10-CM

## 2022-06-22 DIAGNOSIS — M7989 Other specified soft tissue disorders: Secondary | ICD-10-CM | POA: Diagnosis not present

## 2022-06-22 DIAGNOSIS — M79675 Pain in left toe(s): Secondary | ICD-10-CM

## 2022-06-22 MED ORDER — METHYLPREDNISOLONE 4 MG PO TBPK: ORAL_TABLET | ORAL | 0 refills | Status: DC

## 2022-06-22 NOTE — Patient Instructions (Signed)
For instructions on how to put on your Cam Walking Boot, please visit [email or web address]   Walking Boot, Adult  A walking boot holds your foot or ankle in place after an injury or a medical procedure. This helps with healing and prevents further injury. It has a hard, rigid outer frame that limits movement and supports your foot and leg. The inner lining is a layer of padded material. Walking boots also have adjustable straps to secure them over the foot and leg. A walking boot may be prescribed if you can put weight (bear weight) on your injured foot. How much you can walk while wearing the boot will depend on the type and severity of your injury. How to put on a walking boot There are different types of walking boots. Each type has specific instructions about how to wear it properly. Follow instructions from your health care provider, such as: Ask someone to help you put on the boot, if needed. Sit to put on your boot. Doing this is more comfortable and helps to prevent falls. Open up the boot fully. Place your foot into the boot so your heel rests against the back. Your toes should be supported by the base of the boot. They should not hang over the front edge. Adjust the straps so the boot fits securely but is not too tight. Do not bend the hard frame of the boot to get a good fit. How to walk with a walking boot How much you can walk will depend on your injury. Some tips for managing with a boot include: Do not try to walk without wearing the boot unless your health care provider approves. Use other assistive walking devices, including crutches or canes, as told by your health care provider. On your uninjured foot, wear a shoe with a heel that is close to the height of the walking boot. Be careful when walking on surfaces that are uneven or wet. How to reduce swelling while using a walking boot  Rest your injured foot or leg as much as possible. If directed, put ice on the injured  area. To do this: Put ice in a plastic bag. Place a towel between your skin and the bag. Leave the ice on for 20 minutes, 2-3 times a day. Remove the ice if your skin turns bright red. This is very important. If you cannot feel pain, heat, or cold, you have a greater risk of damage to the area. Keep your injured foot or leg raised (elevated) above the level of your heart for at least 2?3 hours each day or as told by your health care provider. If swelling gets worse, loosen the boot. Rest and elevate your foot and leg. How to care for your skin and foot while using a walking boot Wear a long sock to protect your foot and leg from rubbing inside the boot. Take off the boot one time each day to check the injured area. Check your foot, the surrounding skin, and your leg to make sure there are no sores, rashes, swelling, or wounds. The skin should be a healthy color, not pale or blue. Try to notice if your walking pattern (gait) in the boot is fairly normal and that you are walking without a noticeable limp. Follow instructions from your health care provider about taking care of your incision or wound, if this applies. Clean and wash the injured area as told by your health care provider. Gently dry your foot and leg before putting the boot  back on. Removing your walking boot Follow directions from your health care provider for removing the walking boot. Generally, it is okay to remove your walking boot: When you are resting or sleeping. To clean your foot and leg. How to keep the walking boot clean Do not put any part of the boot in a washing machine or dryer. Do not use chemical cleaning products. These could irritate your skin, especially if you have a wound or an incision. Do not soak the liner of the boot. Use a washcloth with mild soap and water to clean the frame and the liner of the boot by hand. Allow the boot to air-dry completely before you put it back on your foot. Follow these  instructions at home: Activity Your activity will be restricted depending on the type and severity of your injury. Follow instructions from your health care provider. Also: Bathe and shower as told by your health care provider. Do not do any activities that could make your injury worse. Do not drive if your affected foot is the one that you use for driving. Contact a health care provider if: The boot is cracked or damaged. The boot does not fit properly. Your foot or leg hurts. You have a rash, sore, or open sore (ulcer) on your foot or leg. The skin on your foot or leg is pale. You have a wound or incision on the foot and it is getting worse. Your skin becomes painful, red, or irritated. Your swelling does not get better or it gets worse. Get help right away if: You have numbness in your foot or leg. The skin on your foot or leg is cold, blue, or gray. Summary A walking boot holds your foot or ankle in place after an injury or a medical procedure. There are different types of walking boots. Follow instructions about how to correctly wear your boot. Ask someone to help you put on the boot, if needed. It is important to check your skin and foot every day. Call your health care provider if you notice a rash or sore on your foot or leg. This information is not intended to replace advice given to you by your health care provider. Make sure you discuss any questions you have with your health care provider. Document Revised: 11/16/2019 Document Reviewed: 11/16/2019 Elsevier Patient Education  2023 [employer].

## 2022-06-22 NOTE — Progress Notes (Unsigned)
Subjective:   Patient ID: George Sharp, male   DOB: 62 y.o.   MRN: [id number]   HPI Chief Complaint  Patient presents with   Toe Pain    3rd and 4th left - aching, sometimes severely x few months, usually worse at night, seen PCP - xrayed, no fracture, injected-had pain after shot for a week, using ice at home   New Patient (Initial Visit)    It has been 2-3 months. He was on his knees and the toes bent back and it would flare up mostly at night. It would keep him up all night. He would have to get up and it up. He would take tylenol. He got an injeciton and it made it worse.    Review of Systems  All other systems reviewed and are negative.  Past Medical History:  Diagnosis Date   Allergic rhinitis    Asthma    B12 deficiency    Colon polyps    DVT (deep vein thrombosis) 2004   GERD (gastroesophageal reflux disease)    Laryngopharyngeal reflux (LPR)     Past Surgical History:  Procedure Laterality Date   COLONOSCOPY     COLONOSCOPY WITH PROPOFOL N/A 12/07/2016   Procedure: COLONOSCOPY WITH PROPOFOL;  Surgeon: Scot Jun, MD;  Location: [facility] ENDOSCOPY;  Service: Endoscopy;  Laterality: N/A;   COLONOSCOPY WITH PROPOFOL N/A 07/20/2020   Procedure: COLONOSCOPY WITH PROPOFOL;  Surgeon: Toledo, Boykin Nearing, MD;  Location: [facility] ENDOSCOPY;  Service: Gastroenterology;  Laterality: N/A;   FLEXIBLE SIGMOIDOSCOPY     FRACTURE SURGERY Right    thumb   HERNIA REPAIR     NASAL SINUS SURGERY     SHOULDER ARTHROSCOPY WITH SUBACROMIAL DECOMPRESSION, ROTATOR CUFF REPAIR AND BICEP TENDON REPAIR Left 06/08/2021   Procedure: LEFT SHOULDER ARTHROSCOPY WITH DEBRIDEMENT, DECOMPRESSION, DISTAL CLAVICLE EXCISION, ROTATOR CUFF REPAIR, AND BICEPS TENODESIS;  Surgeon: Christena Flake, MD;  Location: [facility] ORS;  Service: Orthopedics;  Laterality: Left;     Current Outpatient Medications:    acetaminophen (TYLENOL) 500 MG tablet, Take 500 mg by mouth every 6 (six) hours as needed for moderate pain,  mild pain or headache., Disp: , Rfl:    cetirizine (ZYRTEC) 10 MG tablet, Take 10 mg by mouth daily., Disp: , Rfl:    Cholecalciferol 25 MCG (1000 UT) tablet, Take 1,000 Units by mouth once a week., Disp: , Rfl:    ELIQUIS 5 MG TABS tablet, Take 5 mg by mouth 2 (two) times daily., Disp: , Rfl:    omeprazole (PRILOSEC) 20 MG capsule, Take 20 mg by mouth daily as needed for heartburn., Disp: , Rfl:    vitamin B-12 (CYANOCOBALAMIN) 1000 MCG tablet, Take 1,000 mcg by mouth once a week., Disp: , Rfl:   No Known Allergies       Objective:  Physical Exam  General: AAO x3, NAD  Dermatological: Skin is warm, dry and supple bilateral. Nails There are no open sores, no preulcerative lesions, no rash or signs of infection present.  Vascular: Dorsalis Pedis artery and Posterior Tibial artery pedal pulses are 2/4 bilateral with immedate capillary fill time.  There is no pain with calf compression, swelling, warmth, erythema.   Neruologic: Grossly intact via light touch bilateral.  Negative Tinel sign.  Musculoskeletal: Tenderness localized along the left third interspace.  Not able to palpate a neuroma today and there is no area pinpoint tenderness.  No instability present to the MPJ.  MMT 5/5.  Gait: Unassisted, Nonantalgic.  Assessment:   Capsulitis, concern for possible neuroma vs sprain     Plan:  -Treatment options discussed including all alternatives, risks, and complications -Etiology of symptoms were discussed -X-rays were obtained reviewed of the left foot.  3 views of the foot were obtained.  No evidence of acute fracture. -Given his continued pain immobilized in cam boot was dispensed to help promote and facilitate soft tissue healing.  Prescribed a Medrol Dosepak.  Recommend holding off on anti-inflammatories given blood thinner.  If no improvement consider advanced imaging.   Vivi Barrack DPM

## 2022-10-30 ENCOUNTER — Ambulatory Visit: Payer: Self-pay | Admitting: General Surgery

## 2022-10-30 NOTE — H&P (Signed)
PATIENT PROFILE: George Sharp is a 62 y.o. male who presents to the Clinic for consultation at the request of Dr. Hyacinth Meeker for evaluation of hemorrhoids.  PCP:  Carlynn Purl, MD  HISTORY OF PRESENT ILLNESS: George Sharp reports he has been dealing with hemorrhoids since years ago.  He endorses that recently in the last few months he has been getting more comfortable with the hemorrhoids.  He endorses that the hemorrhoids come out with bowel movements and he needs to push him back in.  The hemorrhoids are not spontaneously reducing.  He is having intermittent bleeding as well.  He denies significant pain.  No radiation.  Aggravating factor is with a come out with bowel movements.  Patient has tried multiple over-the-counter hemorrhoid treatments without any relief.  No alleviating factors.  Patient had a colonoscopy on 07/20/2020.  Small polyps removed.  Mild diverticulosis.  Hemorrhoids identified.  I personally [place] the images.  Patient had history of DVT 20 years ago.  He has been using him anticoagulations for lifetime.  As per patient he was found with a genetic predisposition to clot formation so recommendation was to continue blood thinner for lifetime.   PROBLEM LIST: Problem List  Date Reviewed: 08/16/2022          Noted   Nonalcoholic steatohepatitis (NASH) 03/22/2022   Overview    FibroScan slightly abnormal, [facility]      Superior labrum anterior-to-posterior (SLAP) tear of left shoulder 05/08/2021   Vitamin D deficiency 08/26/2019   Overview    24-2021 45-2022      Tubular adenoma 08/05/2017   Overview    4/13 x 1, 11/18 x 3, 6/22 x1      B12 deficiency 08/03/2015   Overview    191, 6/17 255, 7/19      Laryngopharyngeal reflux (LPR) 12/28/2013   DVT (deep venous thrombosis) (CMS/HHS-HCC) 06/09/2013   Overview    Factor 2 mutation, chronic Eliquis       GENERAL REVIEW OF SYSTEMS:   General ROS: negative for - chills, fatigue, fever, weight gain or  weight loss Allergy and Immunology ROS: negative for - hives  Hematological and Lymphatic ROS: negative for - bleeding problems or bruising, negative for palpable nodes Endocrine ROS: negative for - heat or cold intolerance, hair changes Respiratory ROS: negative for - cough, shortness of breath or wheezing Cardiovascular ROS: no chest pain or palpitations GI ROS: negative for nausea, vomiting, abdominal pain, diarrhea, constipation Musculoskeletal ROS: negative for - joint swelling or muscle pain Neurological ROS: negative for - confusion, syncope Dermatological ROS: negative for pruritus and rash Psychiatric: negative for anxiety, depression, difficulty sleeping and memory loss  MEDICATIONS: Current Outpatient Medications  Medication Sig Dispense Refill   acetaminophen (TYLENOL) 500 MG tablet Take by mouth once as needed     apixaban (ELIQUIS) 5 mg tablet Take 1 tablet (5 mg total) by mouth every 12 (twelve) hours 180 tablet 3   cetirizine (ZYRTEC) 10 MG tablet Take 10 mg by mouth once daily     cholecalciferol (VITAMIN D3) 1000 unit tablet Take 1 tablet (1,000 Units total) by mouth once a week     ciprofloxacin HCl (CIPRO) 500 MG tablet Take 1 tablet (500 mg total) by mouth 2 (two) times daily for 10 days 20 tablet 0   cyanocobalamin (VITAMIN B12) 1000 MCG tablet Take 1 tablet (1,000 mcg total) by mouth once a week     omeprazole (PRILOSEC) 20 MG DR capsule Take 1 capsule (  20 mg total) by mouth 2 (two) times daily 180 capsule 3   No current facility-administered medications for this visit.    ALLERGIES: Patient has no known allergies.  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 Maggie Font @ [facility] - Adenomatous Polyps: CBF 12/2019   FRACTURE SURGERY  2019   COLONOSCOPY  07/20/2020   Tubular adenoma/Hyperplastic polyp/PHx CP/Repeat 67yrs/TKT    Extensive arthroscopic debridement, arthroscopic subacromial decompression, arthroscopic excision of the distal clavicle, mini-open rotator cuff repair using a [employer] Regeneten patch, and mini-open biceps tenodesis, left shoulder Left 06/08/2021   Dr.Poggi   COLONOSCOPY  05/17/2011, 11/12/2005, 02/14/1994   Adenomatous Polyps: CBF 05/2016; Recall Ltr mailed 04/10/2016 (dw)   FLEXIBLE SIGMOIDOSCOPY  03/29/1997, 09/27/1993, 08/15/1993, 07/28/1993   HERNIA REPAIR       FAMILY HISTORY: Family History  Problem Relation Name Age of Onset   No Known Problems Mother     Stroke Father     Lung cancer Brother     Aneurysm Brother     Cancer Brother     Myocardial Infarction (Heart attack) Brother       SOCIAL HISTORY: Social History   Socioeconomic History   Marital status: Married  Tobacco Use   Smoking status: Former    Current packs/day: 0.00    Types: Cigarettes    Quit date: 1982    Years since quitting: 42.7   Smokeless tobacco: Never  Vaping Use   Vaping status: Never Used  Substance and Sexual Activity   Alcohol use: Not Currently    Comment: 1-4 a week   Drug use: No   Sexual activity: Yes  Social History Narrative   Works full time.   Social Determinants of Health   Financial Resource Strain: Low Risk  (09/26/2022)   Overall Financial Resource Strain (CARDIA)    Difficulty of Paying Living Expenses: Not hard at all  Food Insecurity: No Food Insecurity (09/26/2022)   Hunger Vital Sign    Worried About Running Out of Food in the Last Year: Never true    Ran Out of Food in the Last Year: Never true  Transportation Needs: No Transportation Needs  (09/26/2022)   PRAPARE - Administrator, Civil Service (Medical): No    Lack of Transportation (Non-Medical): No    PHYSICAL EXAM: Vitals:   10/30/22 1413  BP: 112/70  Pulse: 66   Body mass index is 22.82 kg/m. Weight: 78.5 kg (173 lb)   GENERAL: Alert, active, oriented x3  HEENT: Pupils equal reactive to light. Extraocular movements are intact. Sclera clear. Palpebral conjunctiva normal red color.Pharynx clear.  NECK: Supple with no palpable mass and no adenopathy.  LUNGS: Sound clear with no rales rhonchi or wheezes.  HEART: Regular rhythm S1 and S2 without murmur.  ABDOMEN: Soft and depressible, nontender with no palpable mass, no hepatomegaly.  RECTAL: adequate rectal tone. No masses.  Palpable hemorrhoids.  No fissures.  EXTREMITIES: Well-developed well-nourished symmetrical with no dependent edema.  NEUROLOGICAL: Awake alert oriented, facial expression symmetrical,  moving all extremities.  REVIEW OF DATA: I have reviewed the following data today: Appointment on 09/19/2022  Component Date Value   WBC ([place] Blood Cell Co* 09/19/2022 8.2    RBC (Red Blood Cell Coun* 09/19/2022 4.46 (L)    Hemoglobin 09/19/2022 14.9    Hematocrit 09/19/2022 44.2    MCV (Mean Corpuscular Vo* 09/19/2022 99.1    MCH (Mean Corpuscular He* 09/19/2022 33.4 (H)    MCHC (Mean Corpuscular H* 09/19/2022 33.7    Platelet Count 09/19/2022 204    RDW-CV (Red Cell Distrib* 09/19/2022 13.4    MPV (Mean Platelet Volum* 09/19/2022 10.8    Neutrophils 09/19/2022 4.91    Lymphocytes 09/19/2022 1.92    Monocytes 09/19/2022 0.92    Eosinophils 09/19/2022 0.30    Basophils 09/19/2022 0.08    Neutrophil % 09/19/2022 60.2    Lymphocyte % 09/19/2022 23.6    Monocyte % 09/19/2022 11.3    Eosinophil % 09/19/2022 3.7    Basophil% 09/19/2022 1.0    Immature Granulocyte % 09/19/2022 0.2    Immature Granulocyte Cou* 09/19/2022 0.02    Glucose 09/19/2022 98    Sodium 09/19/2022 140     Potassium 09/19/2022 4.1    Chloride 09/19/2022 104    Carbon Dioxide (CO2) 09/19/2022 29.2    Urea Nitrogen (BUN) 09/19/2022 15    Creatinine 09/19/2022 0.9    Glomerular Filtration Ra* 09/19/2022 96    Calcium 09/19/2022 9.6    AST  09/19/2022 30    ALT  09/19/2022 41    Alk Phos (alkaline Phosp* 09/19/2022 61    Albumin 09/19/2022 4.4    Bilirubin, Total 09/19/2022 0.7    Protein, Total 09/19/2022 7.0    A/G Ratio 09/19/2022 1.7    Hemoglobin A1C 09/19/2022 5.6    Average Blood Glucose (C* 09/19/2022 114    Cholesterol, Total 09/19/2022 139    Triglyceride 09/19/2022 77    HDL (High Density Lipopr* 74/25/9563 54.1    LDL Calculated 09/19/2022 70    VLDL Cholesterol 09/19/2022 15    Cholesterol/HDL Ratio 09/19/2022 2.6    PSA (Prostate Specific A* 09/19/2022 1.26    Thyroid Stimulating Horm* 09/19/2022 1.378    Color 09/19/2022 Light Yellow    Clarity 09/19/2022 Clear    Specific Gravity 09/19/2022 1.015    pH, Urine 09/19/2022 7.5    Protein, Urinalysis 09/19/2022 Negative    Glucose, Urinalysis 09/19/2022 Negative    Ketones, Urinalysis 09/19/2022 Negative    Blood, Urinalysis 09/19/2022 Negative    Nitrite, Urinalysis 09/19/2022 Negative    Leukocyte Esterase, Urin* 09/19/2022 Negative    Bilirubin, Urinalysis 09/19/2022 Negative    Urobilinogen, Urinalysis 09/19/2022 0.2    WBC, UA 09/19/2022 1    Red Blood Cells, Urinaly* 09/19/2022 <1    Bacteria, Urinalysis 09/19/2022 0-5    Squamous Epithelial Cell* 09/19/2022 0    Vitamin B12 09/19/2022 276 (L)   Office Visit on 08/16/2022  Component Date Value   Influenza A PCR 08/16/2022 Negative    Influenza B PCR 08/16/2022 Negative    RSV PCR 08/16/2022 Negative    SARS-CoV2 PCR 08/16/2022 Negative      ASSESSMENT: Mr. Calderon is a 62 y.o. male presenting for consultation for Hemorrhoids.    Patient was found on physical exam with finding of internal and external hemorrhoids. Patient oriented about the diagnosis of  hemorrhoids, its function and anatomy. The different treatment alternatives were discussed (conservative management, office procedures and surgical treatment). Patient was oriented of why it  should be started with conservative management (fiber supplement, water intake).  Patient was oriented that the hemorrhoids are a matter of quality of life. Patient has to be aware of how much the hemorrhoids are affecting (pain?, bleeding?, discomfort?, prolapse?, leakage?) the quality of life to receive a benefit from a more invasive procedure. If current symptoms does not improve with conservative management discussed with patient, invasive alternatives will be discussed as next step.  Since patient has grade 3 internal hemorrhoids with bleeding, due to the size and I think that hemorrhoid banding will be good enough for control of the hemorrhoid bulging.  Also patient on blood thinner and I think that holding the blood thinner will be longer with internal hemorrhoid banding compared to surgery.  Discussed this recommendation of patient understanding that his best alternative is surgical hemorrhoidectomy.  The patient reported he understood and agreed to proceed with hemorrhoidectomy.  Discussed with patient the risks of surgery including bleeding, infection, incontinence, chronic pain, perianal abscess, among others.  The patient reported he understood and agreed to proceed.  Hemorrhoids, internal, with bleeding [K64.8]  PLAN: We discussed about hemorrhoidectomy (62130) Hold Eliquis 2 days before surgery Increase fiber in the diet Contact us if you have any concern  Patient verbalized understanding, all questions were answered, and were agreeable with the plan outlined above.     Carolan Shiver, MD  Electronically signed by Carolan Shiver, MD

## 2022-11-02 ENCOUNTER — Encounter: Payer: Self-pay | Admitting: Urgent Care

## 2022-11-06 ENCOUNTER — Inpatient Hospital Stay: Admission: RE | Admit: 2022-11-06 | Payer: BC Managed Care – PPO | Source: Ambulatory Visit

## 2022-11-14 ENCOUNTER — Ambulatory Visit: Admit: 2022-11-14 | Payer: BC Managed Care – PPO | Admitting: General Surgery

## 2022-11-14 SURGERY — HEMORRHOIDECTOMY
Anesthesia: General | Site: Rectum

## 2023-01-03 IMAGING — MR MR CERVICAL SPINE W/O CM
6 series · 39 of 48 positions shown · non-contrast
Comparison: 06/26/2014

CLINICAL DATA: Limited range of motion, left arm pain, numbness,
weakness

EXAM:
MRI CERVICAL SPINE WITHOUT CONTRAST
TECHNIQUE: Multiplanar, multisequence MR imaging of the cervical spine was
performed. No intravenous contrast was administered.

[Series 5: T2 · sagittal · 3.0mm · 0.62mm/px · 6 of 15 slices shown (1 of 3)]
[im 1/15]
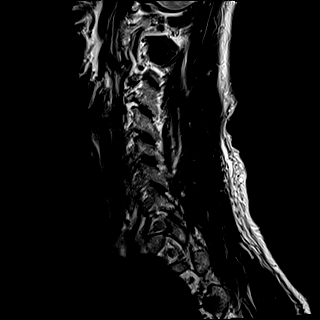
[im 3/15]
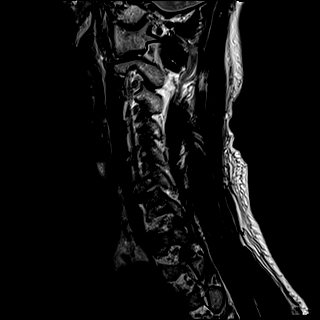
[im 6/15]
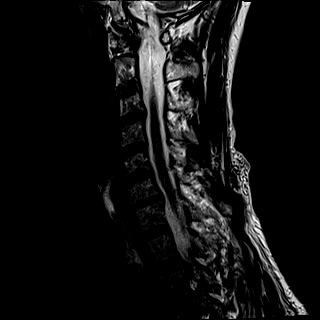
[im 9/15]
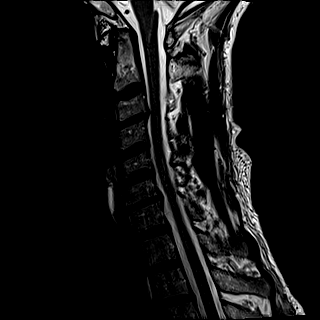
[im 12/15]
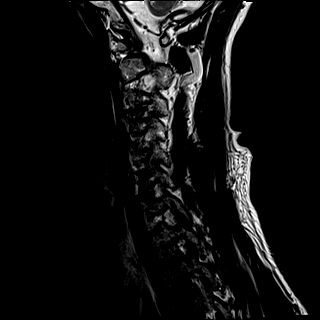
[im 15/15]
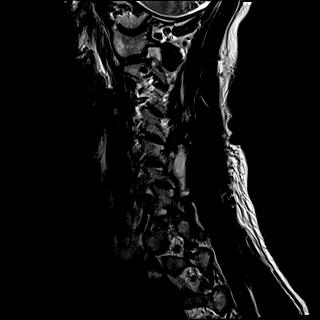

[Series 6: FLAIR · sagittal · 3.0mm · 0.78mm/px · 6 of 15 slices shown]
[im 1/15]
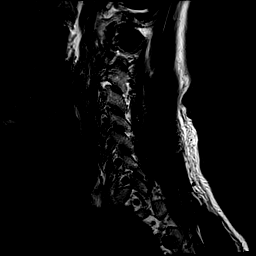
[im 3/15]
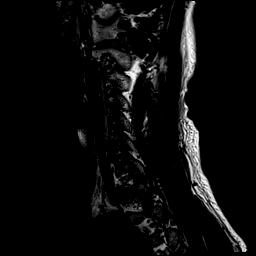
[im 6/15]
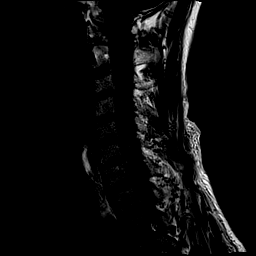
[im 9/15]
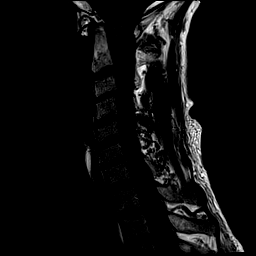
[im 12/15]
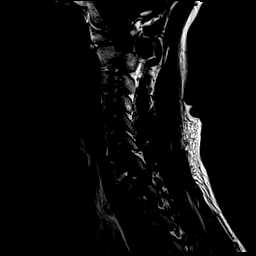
[im 15/15]
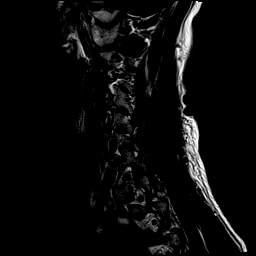

[Series 7: STIR · sagittal · 3.0mm · 0.62mm/px · 6 of 15 slices shown]
[im 1/15]
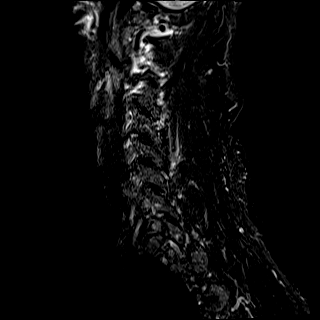
[im 3/15]
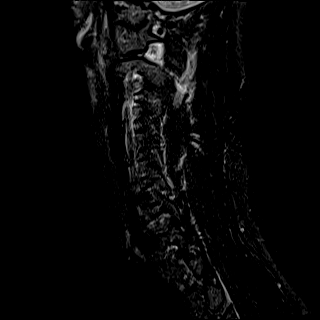
[im 6/15]
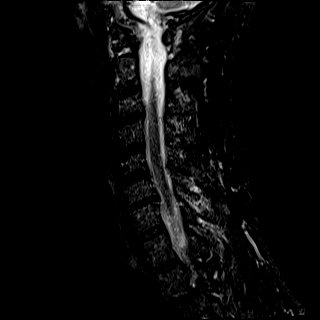
[im 9/15]
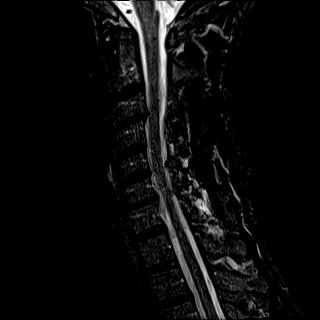
[im 12/15]
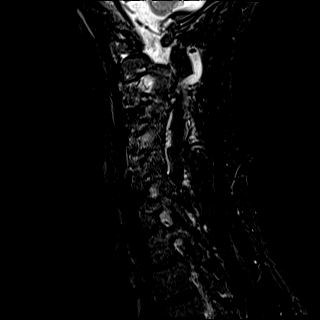
[im 15/15]
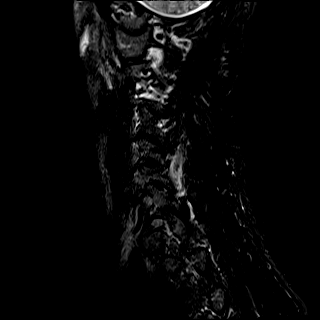

[Series 8: T2 · axial · 3.0mm · 0.70mm/px · z∈[-2,+101]mm · 9 of 31 slices shown (2 of 3)]
[im 1/31]
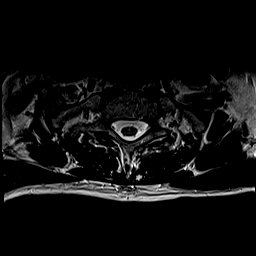
[im 6/31]
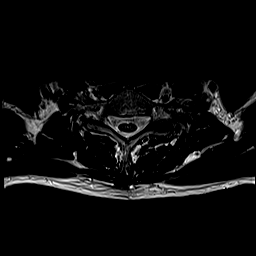
[im 9/31]
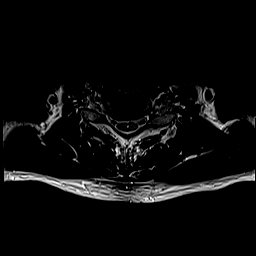
[im 14/31]
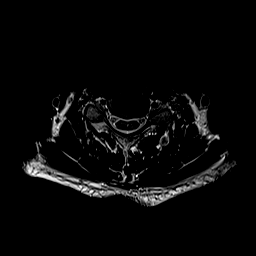
[im 17/31]
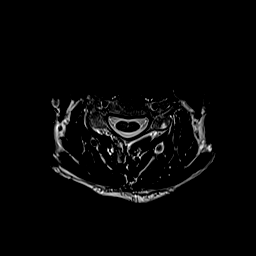
[im 22/31]
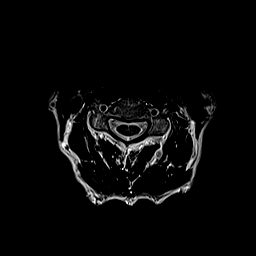
[im 25/31]
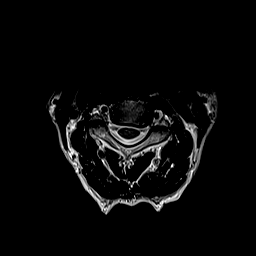
[im 28/31]
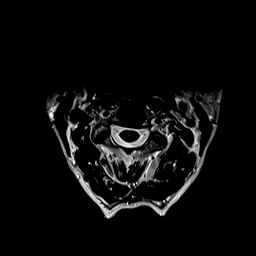
[im 31/31]
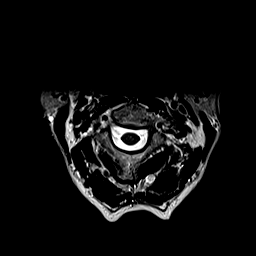

[Series 9: ax mpgr · axial · 3.0mm · 0.35mm/px · z∈[-2,+70]mm · 6 of 31 slices shown]
[im 1/31]
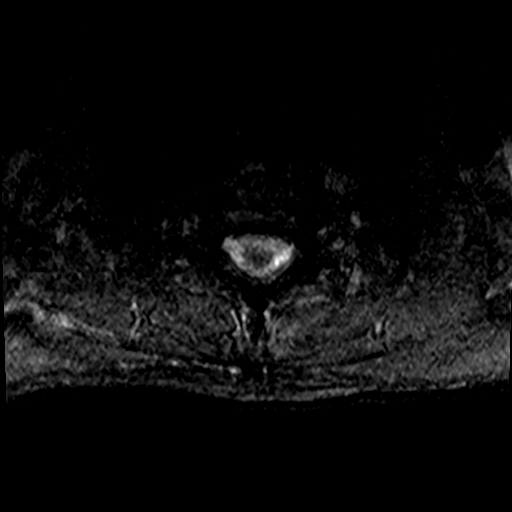
[im 6/31]
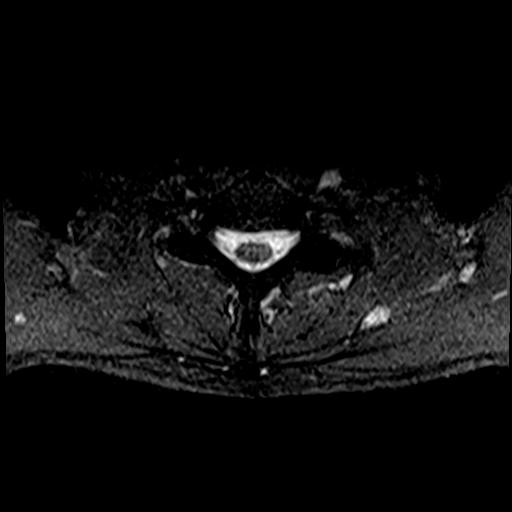
[im 9/31]
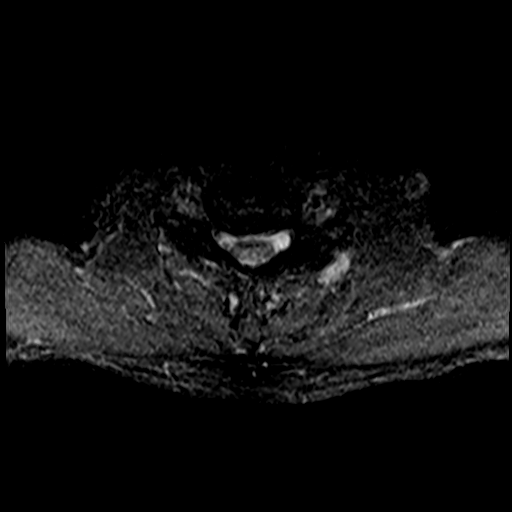
[im 14/31]
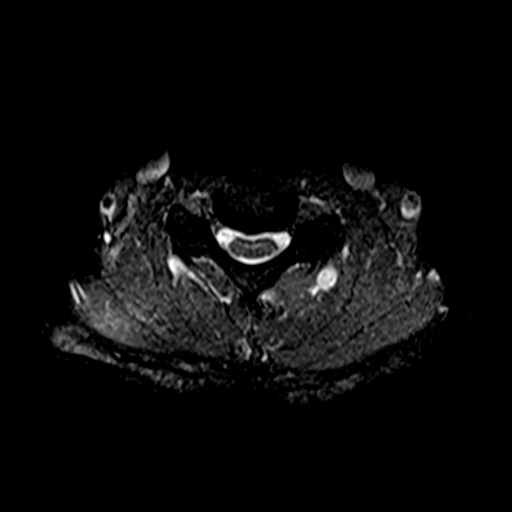
[im 17/31]
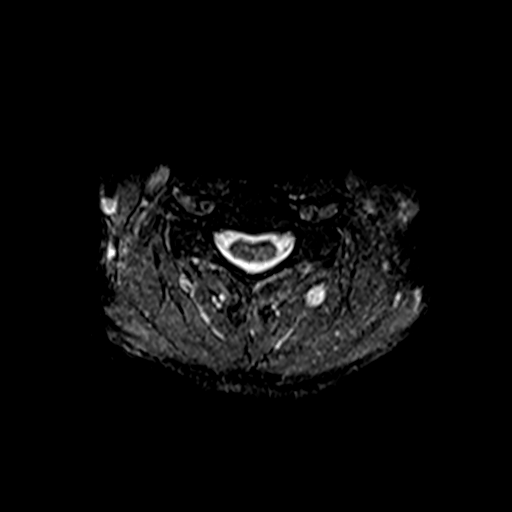
[im 22/31]
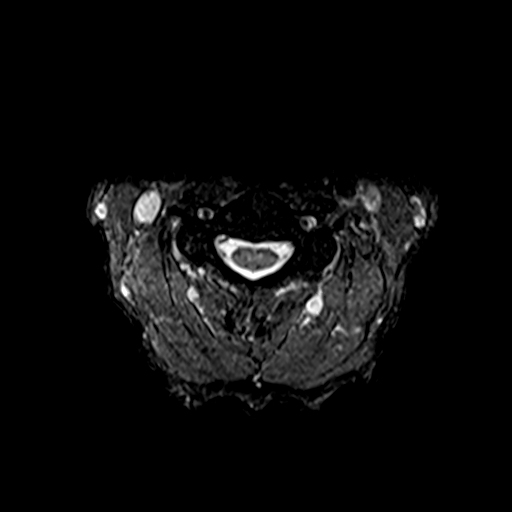

[Series 10: T2 · sagittal · 3.0mm · 0.62mm/px · 6 of 15 slices shown (3 of 3)]
[im 1/15]
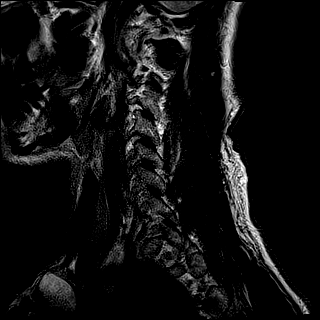
[im 3/15]
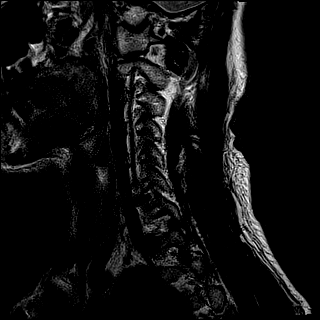
[im 6/15]
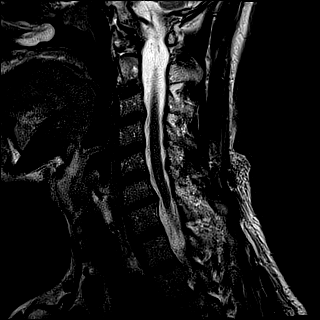
[im 9/15]
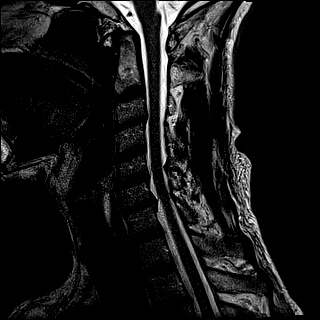
[im 12/15]
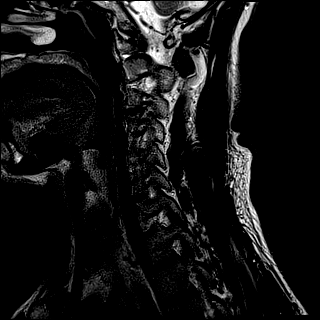
[im 15/15]
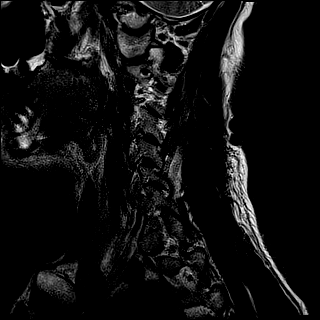

[39 of 48 positions shown; findings below may reference images not displayed]

FINDINGS: Alignment: Physiologic.

Vertebrae: No acute fracture, evidence of discitis, or bone lesion.

Cord: Normal signal and morphology.

Posterior Fossa, vertebral arteries, paraspinal tissues: Posterior
fossa demonstrates no focal abnormality. Vertebral artery flow voids
are maintained. Paraspinal soft tissues are unremarkable.

Disc levels:

Discs: Degenerative disease with mild disc height loss at C3 and
C6-7.

C2-3: No significant disc bulge. No neural foraminal stenosis. No
central canal stenosis.

C3-4: No disc protrusion. Mild bilateral foraminal stenosis. Mild
bilateral facet arthropathy. No spinal stenosis.

C4-5: No significant disc bulge. No neural foraminal stenosis. No
central canal stenosis.

C5-6: No disc protrusion. No right foraminal stenosis. Mild left
foraminal stenosis. No spinal stenosis.

C6-7: Broad-based disc bulge with a broad shallow right paracentral
disc protrusion. Bilateral uncovertebral degenerative changes. No
significant left foraminal stenosis. Moderate right foraminal
stenosis. No spinal stenosis.

C7-T1: No significant disc bulge. No neural foraminal stenosis. No
central canal stenosis.
IMPRESSION: 1. At C6-7 there is a broad-based disc bulge with a broad shallow
right paracentral disc protrusion. Bilateral uncovertebral
degenerative changes. stenosis. Moderate right foraminal stenosis.
No spinal stenosis.

## 2023-01-17 ENCOUNTER — Encounter: Payer: Self-pay | Admitting: Podiatry

## 2023-01-17 ENCOUNTER — Ambulatory Visit (INDEPENDENT_AMBULATORY_CARE_PROVIDER_SITE_OTHER): Payer: BC Managed Care – PPO

## 2023-01-17 ENCOUNTER — Ambulatory Visit: Payer: BC Managed Care – PPO | Admitting: Podiatry

## 2023-01-17 DIAGNOSIS — M84374A Stress fracture, right foot, initial encounter for fracture: Secondary | ICD-10-CM | POA: Diagnosis not present

## 2023-01-17 DIAGNOSIS — M778 Other enthesopathies, not elsewhere classified: Secondary | ICD-10-CM | POA: Diagnosis not present

## 2023-01-17 DIAGNOSIS — M7741 Metatarsalgia, right foot: Secondary | ICD-10-CM

## 2023-01-17 DIAGNOSIS — I739 Peripheral vascular disease, unspecified: Secondary | ICD-10-CM | POA: Diagnosis not present

## 2023-01-17 NOTE — Progress Notes (Signed)
Subjective: Chief Complaint  Patient presents with   Foot Pain    Patient states he has right foot pain , patient states his right foot 2nd toe is growing weird patient says he thinks it is growing left. He believes that is the reason for pain but is not sure. No medication for pain , this has been going on for a while but has been worst the last 2 weeks.   62 year old male presents the office with above concerns.  He states that he thought he broke his second toe previously and also deviate, go immediately.  He has pain also laterally over the dorsal forefoot.  No recent injuries that he reports at this time.  Getting worse over the last 2 weeks.  Objective: AAO x3, NAD DP/PT pulses palpable bilaterally, CRT less than 3 seconds There is surgical patient on third, fourth metatarsals proximally but there is no pain about the dorsal incision.  There is a trace edema with no erythema or warmth.  There is also tenderness palpation left second toe there is medial deviation of the digit.  Flexor, extensor tendons appear to be intact. No pain with calf compression, swelling, warmth, erythema  Assessment: Right foot metatarsalgia, concern for possible stress fracture  Plan: -All treatment options discussed with the patient including all alternatives, risks, complications.  -X-rays obtained reviewed.  Although there is no evidence of acute fracture or stress fracture noted.  Metatarsus adductus present.  Also calcification is also present. -Following x-rays are not consistent with fracture he does have tenderness directly on the metatarsal and concern about developing stress fracture.  There is placed in into a surgical shoe immobilization. -As his pain improves he can transition back to regular, supportive shoe. -Given the vessel calcification ordered ABI -Patient encouraged to call the office with any questions, concerns, change in symptoms.   No follow-ups on file.  Vivi Barrack DPM

## 2023-02-08 ENCOUNTER — Telehealth (HOSPITAL_COMMUNITY): Payer: Self-pay

## 2023-02-08 NOTE — Telephone Encounter (Signed)
 Attempted to contact the following patient three times over three weeks to attempt to schedule their VAS US  order. Unable to reach patient for scheduling purposes, will inform referring office. If i do not hear back from the patient in the next 7 days, the order is to be canceled per protocol.  02/08/23 left message 01/31/23 left message 01/21/23 left message

## 2023-02-12 ENCOUNTER — Ambulatory Visit: Payer: BC Managed Care – PPO | Admitting: Podiatry

## 2023-02-21 ENCOUNTER — Encounter: Payer: Self-pay | Admitting: Podiatry

## 2023-05-20 ENCOUNTER — Encounter: Payer: Self-pay | Admitting: Internal Medicine

## 2023-06-17 ENCOUNTER — Telehealth: Payer: Self-pay | Admitting: Podiatry

## 2023-06-17 NOTE — Telephone Encounter (Signed)
 PT states that doctor referred him to get a cardiovascular x-ray. But he missed the appointment and needs a new order for the Xray.PT Phone no.[phone]

## 2023-06-21 ENCOUNTER — Other Ambulatory Visit: Payer: Self-pay | Admitting: Podiatry

## 2023-06-21 DIAGNOSIS — I739 Peripheral vascular disease, unspecified: Secondary | ICD-10-CM

## 2023-06-28 ENCOUNTER — Ambulatory Visit: Admitting: Podiatry

## 2023-06-28 ENCOUNTER — Ambulatory Visit

## 2023-06-28 DIAGNOSIS — G5762 Lesion of plantar nerve, left lower limb: Secondary | ICD-10-CM | POA: Diagnosis not present

## 2023-06-28 DIAGNOSIS — M7751 Other enthesopathy of right foot: Secondary | ICD-10-CM

## 2023-06-28 MED ORDER — TRIAMCINOLONE ACETONIDE 10 MG/ML IJ SUSP
5.0000 mg | Freq: Once | INTRAMUSCULAR | Status: AC
  Administered 2023-06-28: 5 mg via INTRAMUSCULAR

## 2023-06-28 NOTE — Patient Instructions (Signed)

## 2023-07-05 ENCOUNTER — Ambulatory Visit: Admitting: Podiatry

## 2023-07-10 ENCOUNTER — Ambulatory Visit (HOSPITAL_COMMUNITY)
Admission: RE | Admit: 2023-07-10 | Discharge: 2023-07-10 | Disposition: A | Discharge status: Home / Self Care | Source: Ambulatory Visit | Attending: Vascular Surgery | Admitting: Vascular Surgery

## 2023-07-10 DIAGNOSIS — I739 Peripheral vascular disease, unspecified: Secondary | ICD-10-CM | POA: Diagnosis present

## 2023-07-10 LAB — VAS US ABI WITH/WO TBI

## 2023-07-12 ENCOUNTER — Ambulatory Visit: Admitting: Podiatry

## 2023-08-27 ENCOUNTER — Ambulatory Visit: Payer: Self-pay | Admitting: Internal Medicine

## 2023-08-27 ENCOUNTER — Ambulatory Visit: Admitting: Internal Medicine

## 2023-08-27 ENCOUNTER — Encounter: Payer: Self-pay | Admitting: Internal Medicine

## 2023-08-27 ENCOUNTER — Other Ambulatory Visit (INDEPENDENT_AMBULATORY_CARE_PROVIDER_SITE_OTHER)

## 2023-08-27 VITALS — BP 112/74 | HR 64 | Ht 73.0 in | Wt 171.5 lb

## 2023-08-27 DIAGNOSIS — R748 Abnormal levels of other serum enzymes: Secondary | ICD-10-CM

## 2023-08-27 DIAGNOSIS — I82409 Acute embolism and thrombosis of unspecified deep veins of unspecified lower extremity: Secondary | ICD-10-CM | POA: Diagnosis not present

## 2023-08-27 DIAGNOSIS — R7989 Other specified abnormal findings of blood chemistry: Secondary | ICD-10-CM | POA: Diagnosis not present

## 2023-08-27 DIAGNOSIS — D682 Hereditary deficiency of other clotting factors: Secondary | ICD-10-CM

## 2023-08-27 DIAGNOSIS — R945 Abnormal results of liver function studies: Secondary | ICD-10-CM | POA: Diagnosis not present

## 2023-08-27 DIAGNOSIS — E538 Deficiency of other specified B group vitamins: Secondary | ICD-10-CM | POA: Diagnosis not present

## 2023-08-27 DIAGNOSIS — Z7901 Long term (current) use of anticoagulants: Secondary | ICD-10-CM

## 2023-08-27 DIAGNOSIS — Z86718 Personal history of other venous thrombosis and embolism: Secondary | ICD-10-CM

## 2023-08-27 DIAGNOSIS — Z860101 Personal history of adenomatous and serrated colon polyps: Secondary | ICD-10-CM

## 2023-08-27 DIAGNOSIS — Z860100 Personal history of colon polyps, unspecified: Secondary | ICD-10-CM

## 2023-08-27 LAB — HEPATIC FUNCTION PANEL
ALT: 76 U/L — ABNORMAL HIGH (ref 0–53)
AST: 55 U/L — ABNORMAL HIGH (ref 0–37)
Albumin: 4.4 g/dL (ref 3.5–5.2)
Alkaline Phosphatase: 59 U/L (ref 39–117)
Bilirubin, Direct: 0.1 mg/dL (ref 0.0–0.3)
Total Bilirubin: 0.7 mg/dL (ref 0.2–1.2)
Total Protein: 7.3 g/dL (ref 6.0–8.3)

## 2023-08-27 LAB — GAMMA GT: GGT: 15 U/L (ref 7–51)

## 2023-08-27 LAB — VITAMIN B12: Vitamin B-12: 448 pg/mL (ref 211–911)

## 2023-08-27 NOTE — Patient Instructions (Signed)
 Your provider has requested that you go to the basement level for lab work before leaving today. Press B on the elevator. The lab is located at the first door on the left as you exit the elevator.  You will be due for a recall colonoscopy in 07/2027. We will send you a reminder in the mail when it gets closer to that time.  _______________________________________________________  If your blood pressure at your visit was 140/90 or greater, please contact your primary care physician to follow up on this.  _______________________________________________________  If you are age 53 or older, your body mass index should be between 23-30. Your Body mass index is 22.63 kg/m. If this is out of the aforementioned range listed, please consider follow up with your Primary Care Provider.  If you are age 4 or younger, your body mass index should be between 19-25. Your Body mass index is 22.63 kg/m. If this is out of the aformentioned range listed, please consider follow up with your Primary Care Provider.   ________________________________________________________  The [facility] providers would like to encourage you to use MYCHART to communicate with providers for non-urgent requests or questions.  Due to long hold times on the telephone, sending your provider a message by [facility] may be a faster and more efficient way to get a response.  Please allow 48 business hours for a response.  Please remember that this is for non-urgent requests.  _______________________________________________________  [facility] is using a team-based approach to care.  Your team is made up of your doctor and two to three APPS. Our APPS (Nurse Practitioners and Physician Assistants) work with your physician to ensure care continuity for you. They are fully qualified to address your health concerns and develop a treatment plan. They communicate directly with your gastroenterologist to care for you. Seeing the [facility] Practitioners on your physician's team can help you by facilitating care more promptly, often allowing for earlier appointments, access to diagnostic testing, procedures, and other specialty referrals.

## 2023-08-27 NOTE — Addendum Note (Signed)
 Addended by: MADAN, Yannis Gumbs L on: 08/27/2023 02:07 PM   Modules accepted: Orders

## 2023-08-27 NOTE — Progress Notes (Signed)
 Patient ID: George Sharp, male   DOB: 04/08/1960, 63 y.o.   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 Cleotilde for evaluation of persistently and intermittently elevated liver enzymes.  He is here alone today.   He has been experiencing intermittently elevated liver enzymes for the past two and a half years. An ultrasound of the liver with Doppler on May 24, 2022, showed mild coarsening of the liver texture but no focal hepatic lesions or gallstones, and appropriate blood flow in the hepatic vasculature. Despite extensive evaluation, including additional blood work at [facility] when he saw hepatology in February 2024, a definitive diagnosis has not been established.  His liver enzymes, specifically AST and ALT, have fluctuated over time, with normal levels in February 2025 and elevated levels in March 2025.  AST when normal has been in the 30 to low 40 range and with elevated anywhere from 60-110.  In March after liver enzymes were slightly elevated he was treated with 10 days of prednisone  and then several weeks later his enzymes were again normal.  This raise the question of an inflammatory etiology.  He has been tested for various conditions including celiac disease, iron overload, and autoimmune hepatitis, all of which were negative. A GGT test was performed once and was normal. His B12 levels have fluctuated, and he takes a B12 supplement twice a week.  Celiac testing was negative.  Viral hep otology negative.  He did have an elevated serum free light chain kappa.  Thyroid testing is been normal.  ANA was slightly positive at 1-40.  Anti-smooth muscle and  antimitochondrial antibodies negative.  PETH was negative.  There is no known family history of liver disease. His brother survived stage four lung cancer, and another brother passed away from a brain aneurysm. He does not consume alcohol regularly and has stopped drinking entirely since the liver issues began.  No jaundice.  No ascites.  No edema.  No abdominal pain.  No change in bowel habit.  No blood in stool or melena.  He was born and raised in [place].   Past Medical History:  Diagnosis Date   Allergic rhinitis    Asthma    B12 deficiency    Colon polyps    DVT (deep vein thrombosis) 2004   GERD (gastroesophageal reflux disease)    Laryngopharyngeal reflux (LPR)     Past Surgical History:  Procedure Laterality Date   COLONOSCOPY     COLONOSCOPY WITH PROPOFOL  N/A 12/07/2016   Procedure: COLONOSCOPY WITH PROPOFOL ;  Surgeon: Viktoria Lamar DASEN, MD;  Location: [facility] ENDOSCOPY;  Service: Endoscopy;  Laterality: N/A;   COLONOSCOPY WITH PROPOFOL  N/A 07/20/2020   Procedure: COLONOSCOPY WITH PROPOFOL ;  Surgeon: Toledo, Ladell POUR, MD;  Location: [facility] ENDOSCOPY;  Service: Gastroenterology;  Laterality: N/A;   FLEXIBLE SIGMOIDOSCOPY     FRACTURE SURGERY Right    thumb   HERNIA REPAIR     NASAL SINUS SURGERY     SHOULDER ARTHROSCOPY WITH SUBACROMIAL DECOMPRESSION, ROTATOR CUFF REPAIR AND BICEP TENDON REPAIR Left 06/08/2021   Procedure: LEFT SHOULDER ARTHROSCOPY WITH DEBRIDEMENT, DECOMPRESSION, DISTAL CLAVICLE EXCISION, ROTATOR CUFF REPAIR, AND BICEPS TENODESIS;  Surgeon: Edie Rush  J, MD;  Location: [facility] ORS;  Service: Orthopedics;  Laterality: Left;    Outpatient Medications Prior to Visit  Medication Sig Dispense Refill   acetaminophen  (TYLENOL ) 500 MG tablet Take 500 mg by mouth every 6 (six) hours as needed for moderate pain, mild pain or headache.     cetirizine (ZYRTEC) 10 MG tablet Take 10 mg by mouth daily.     Cholecalciferol 25 MCG (1000 UT) tablet Take 1,000 Units by mouth  once a week.     ELIQUIS 5 MG TABS tablet Take 5 mg by mouth 2 (two) times daily.     omeprazole (PRILOSEC OTC) 20 MG tablet Take 20 mg by mouth as needed.     vitamin B-12 (CYANOCOBALAMIN ) 1000 MCG tablet Take 1,000 mcg by mouth once a week.     omeprazole (PRILOSEC) 20 MG capsule Take 20 mg by mouth daily as needed for heartburn.     methylPREDNISolone  (MEDROL  DOSEPAK) 4 MG TBPK tablet Take as directed 21 tablet 0   No facility-administered medications prior to visit.    No Known Allergies  Family History  Problem Relation Age of Onset   Emphysema Mother    Aortic aneurysm Father    Colon cancer Neg Hx    Colon polyps Neg Hx    Esophageal cancer Neg Hx    Pancreatic cancer Neg Hx    Stomach cancer Neg Hx     Social History   Tobacco Use   Smoking status: Never   Smokeless tobacco: Never  Vaping Use   Vaping status: Never Used  Substance Use Topics   Alcohol use: Not Currently   Drug use: No    ROS: As per history of present illness, otherwise negative  BP 112/74   Pulse 64   Ht 6' 1 (1.854 m)   Wt 171 lb 8 oz (77.8 kg)   SpO2 96%   BMI 22.63 kg/m  Gen: awake, alert, NAD HEENT: anicteric  Abd: soft, NT/ND, +BS throughout Ext: no c/c/e Neuro: nonfocal   RELEVANT LABS AND IMAGING: Lab work reviewed extensively by care everywhere   Results   LABS AST: 52 (04/26/2023) ALT: 76 (04/26/2023) AST: 29 (05/10/2023) ALT: 36 (05/10/2023)  RADIOLOGY Liver ultrasound with Doppler: Liver mildly coarsened with smooth contour, no focal hepatic lesions, no gallstones, patent hepatic vasculature with appropriate blood flow direction (05/24/2022)  DIAGNOSTIC Colonoscopy: Small hemorrhoids, diverticulosis, tubular adenoma, hyperplastic polyp (07/20/2020)      ASSESSMENT/PLAN: 63 year old male with a 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 Cleotilde for evaluation of persistently and intermittently elevated liver enzymes.   Elevated liver enzymes Intermittent AST and ALT elevation with normal GGT several years ago. Previous workup negative. Prednisone  trial indicated possible inflammation, but given fluctuation and intermittently normal enzymes I am not sure we can accurately make this association. Slightly elevated kappa light chain which I think we should repeat.  Liver function intact but risk of fibrosis if inflammation persists. - Order AST, ALT, and GGT to confirm liver origin. - Repeat kappa light chain test for fluctuations. - Repeat ANA given previously positive result but check IgG also - Consider liver biopsy if enzyme levels remain elevated.  Tubular adenoma Tubular adenoma found in June 2022. Follow-up colonoscopy recommended in seven years. - Schedule  follow-up colonoscopy in June 2029.  Factor II deficiency Managed with Eliquis due to previous DVT  Deep vein thrombosis (DVT) Managed with long-term anticoagulation therapy. Switched to Eliquis due to INR regulation issues with warfarin and Coumadin.   B12 deficiency -Serum B12 today           Rr:Fpoozm, Oneil FALCON, Md [address] [facility] [place],  [place] [address]

## 2023-08-28 LAB — KAPPA/LAMBDA LIGHT CHAINS
Kappa free light chain: 23.8 mg/L — ABNORMAL HIGH (ref 3.3–19.4)
Kappa:Lambda Ratio: 1.18 (ref 0.26–1.65)
Lambda Free Lght Chn: 20.2 mg/L (ref 5.7–26.3)

## 2023-08-29 LAB — IGG: IgG (Immunoglobin G), Serum: 1252 mg/dL (ref 600–1540)

## 2023-08-29 LAB — ANA: Anti Nuclear Antibody (ANA): NEGATIVE

## 2023-09-02 ENCOUNTER — Other Ambulatory Visit: Payer: Self-pay

## 2023-09-02 DIAGNOSIS — R7989 Other specified abnormal findings of blood chemistry: Secondary | ICD-10-CM

## 2023-09-10 ENCOUNTER — Ambulatory Visit: Payer: Self-pay | Admitting: General Surgery

## 2023-09-10 NOTE — H&P (View-Only) (Signed)
 History of Present Illness George Sharp is a 63 year old male who presents with a suspected right inguinal hernia referred by Dr. Cleotilde.  He noticed a lump in his right groin area last week, which becomes more apparent when he coughs. The patient reports that the lump does not really affect his bowel movements. He recalls lifting something heavy about a week prior to the onset of symptoms.  He endorses pain localized to the right groin.  Pain aggravated by coughing.  Elevated factor is resting.  Denies any pain radiation.  I personally evaluated CT scan of the abdomen and pelvis on October 18, 2020.  No sign of hernia on that imaging.  He has a history of liver issues with various diagnostic tests, including ultrasounds, but a definitive diagnosis has not been reached. He is scheduled for a liver biopsy on August 19. He is currently under the care of a gastroenterologist.  He has hemorrhoids, which do not significantly impact his quality of life. He manages them by manually reducing them after bowel movements and reports no bleeding.       PAST MEDICAL HISTORY:  Past Medical History:  Diagnosis Date  . AR (allergic rhinitis) 06/09/2013  . Asthma without status asthmaticus (HHS-HCC)   . 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 DVT (deep venous thrombosis) (CMS/HHS-HCC) 02/17/2002   Factor 2 mutation, chronic coumadin switched to now taking Eliquis , LEFT UPPER LEG        PAST SURGICAL HISTORY:   Past Surgical History:  Procedure Laterality Date  . FUNCTIONAL ENDOSCOPIC SINUS SURGERY  2008  . COLONOSCOPY  12/07/2016   Dr. FABIENE Holmes @ [facility] - Adenomatous Polyps: CBF 12/2019  . FRACTURE SURGERY  2019  . COLONOSCOPY  07/20/2020   Tubular adenoma/Hyperplastic polyp/PHx CP/Repeat 44yrs/TKT  .  Extensive arthroscopic debridement, arthroscopic subacromial decompression, arthroscopic excision of the distal clavicle, mini-open rotator cuff repair using a [employer]  Regeneten patch, and mini-open biceps tenodesis, left shoulder Left 06/08/2021   Dr.Poggi  . COLONOSCOPY  05/17/2011, 11/12/2005, 02/14/1994   Adenomatous Polyps: CBF 05/2016; Recall Ltr mailed 04/10/2016 (dw)  . FLEXIBLE SIGMOIDOSCOPY  03/29/1997, 09/27/1993, 08/15/1993, 07/28/1993  . HERNIA REPAIR  1966         MEDICATIONS:  Outpatient Encounter Medications as of 09/10/2023  Medication Sig Dispense Refill  . acetaminophen  (TYLENOL ) 500 MG tablet Take by mouth once as needed    . apixaban (ELIQUIS) 5 mg tablet Take 1 tablet (5 mg total) by mouth every 12 (twelve) hours 180 tablet 3  . cetirizine (ZYRTEC) 10 MG tablet Take 10 mg by mouth once daily    . cholecalciferol (VITAMIN D3) 1000 unit tablet Take 1 tablet (1,000 Units total) by mouth once a week    . clobetasoL (TEMOVATE) 0.05 % ointment Apply topically 2 (two) times daily 45 g 1  . cyanocobalamin  (VITAMIN B12) 1000 MCG tablet Take 1 tablet (1,000 mcg total) by mouth once a week    . omeprazole (PRILOSEC) 20 MG DR capsule Take 1 capsule (20 mg total) by mouth 2 (two) times daily 180 capsule 3  . sulfaSALAzine (AZULFIDINE EN-TABS) 500 mg EC tablet Take 1 tablet (500 mg total) by mouth daily with breakfast 30 tablet 11   No facility-administered encounter medications on file as of 09/10/2023.     ALLERGIES:   Patient has no known allergies.   SOCIAL HISTORY:  Social History   Socioeconomic History  . Marital status: Married  Tobacco Use  . Smoking status: Former    Current packs/day: 0.00    Types: Cigarettes    Quit date: 1982    Years since quitting: 43.6  . Smokeless tobacco: Never  Vaping Use  . Vaping status: Never Used  Substance and Sexual Activity  . Alcohol use: Not Currently    Comment: 1-4 a week  . Drug use: No  . Sexual activity: Yes  Social History Narrative   Works full time.   Social Drivers of Corporate investment banker Strain: Low Risk  (09/26/2022)   Overall Financial Resource Strain (CARDIA)   .  Difficulty of Paying Living Expenses: Not hard at all  Food Insecurity: No Food Insecurity (09/26/2022)   Hunger Vital Sign   . Worried About Programme researcher, broadcasting/film/video in the Last Year: Never true   . Ran Out of Food in the Last Year: Never true  Transportation Needs: No Transportation Needs (09/26/2022)   PRAPARE - Transportation   . Lack of Transportation (Medical): No   . Lack of Transportation (Non-Medical): No    FAMILY HISTORY:  Family History  Problem Relation Name Age of Onset  . No Known Problems Mother    . Stroke Father    . Lung cancer Brother    . Aneurysm Brother    . Cancer Brother    . Myocardial Infarction (Heart attack) Brother       GENERAL REVIEW OF SYSTEMS:   General ROS: negative for - chills, fatigue, fever, weight gain or weight loss Allergy and Immunology ROS: negative for - hives  Hematological and Lymphatic ROS: negative for - bleeding problems or bruising, negative for palpable nodes Endocrine ROS: negative for - heat or cold intolerance, hair changes Respiratory ROS: negative for - cough, shortness of breath or wheezing Cardiovascular ROS: no chest pain or palpitations GI ROS: negative for nausea, vomiting, abdominal pain, diarrhea, constipation Musculoskeletal ROS: negative for - joint swelling or muscle pain Neurological ROS: negative for - confusion, syncope Dermatological ROS: negative for pruritus and rash  PHYSICAL EXAM:  Vitals:   09/10/23 1043  BP: 112/74  Pulse: 61  .  Ht:185.4 cm (6' 1) Wt:78.5 kg (173 lb) ADJ:Anib surface area is 2.01 meters squared. Body mass index is 22.82 kg/m.SABRA   GENERAL: Alert, active, oriented x3  HEENT: Pupils equal reactive to light. Extraocular movements are intact. Sclera clear. Palpebral conjunctiva normal red color.Pharynx clear.  NECK: Supple with no palpable mass and no adenopathy.  LUNGS: Sound clear with no rales rhonchi or wheezes.  HEART: Regular rhythm S1 and S2 without murmur.  ABDOMEN: Soft  and depressible, nontender with no palpable mass, no hepatomegaly.  Right groin palpable lump, reducible.  There is weakness on the left groin as well.  EXTREMITIES: Well-developed well-nourished symmetrical with no dependent edema.  NEUROLOGICAL: Awake alert oriented, facial expression symmetrical, moving all extremities.   Results RADIOLOGY Abdominal CT: Left groin wider opening, no hernia observed Liver ultrasound: Non-specific liver abnormality    Assessment & Plan Right inguinal hernia and possible left inguinal hernia   He has a suspected right inguinal hernia with a palpable lump and discomfort, especially when coughing. There are no bowel movement issues, indicating no intestinal involvement. A CT scan from a year ago showed a wider opening on the left side, but he has no current symptoms there. Hernia openings do not shrink naturally and may worsen over time. Surgical intervention is the only definitive treatment. Laparoscopic repair is the most  common and effective approach, allowing examination and potential repair of both sides. Risks include temporary bruising and swelling post-surgery. Recovery typically occurs within one to two weeks, with most patients returning to normal activities quickly. After shared decision-making, he agreed to surgical repair following his upcoming liver biopsy. Schedule laparoscopic hernia repair after the liver biopsy on September 24, 2023. Perform possible bilateral hernia repair, focusing on the definite right and possible left hernia. Advise avoiding heavy lifting for one to two weeks post-surgery. Recommend measures to prevent constipation post-surgery, such as using laxatives and increasing fiber intake.   Non-recurrent unilateral inguinal hernia without obstruction or gangrene [K40.90]          Patient verbalized understanding, all questions were answered, and were agreeable with the plan outlined above.   Lucas Sjogren, MD  Electronically  signed by Lucas Sjogren, MD

## 2023-09-20 ENCOUNTER — Other Ambulatory Visit: Payer: Self-pay

## 2023-09-20 ENCOUNTER — Encounter
Admission: RE | Admit: 2023-09-20 | Discharge: 2023-09-20 | Disposition: A | Discharge status: Home / Self Care | Source: Ambulatory Visit | Attending: General Surgery | Admitting: General Surgery

## 2023-09-20 HISTORY — DX: Prothrombin gene mutation: D68.52

## 2023-09-20 HISTORY — DX: Long term (current) use of anticoagulants: Z79.01

## 2023-09-20 NOTE — Patient Instructions (Addendum)
 Your procedure is scheduled on: 09/23/23  Report to the Registration Desk on the 1st floor of the [facility]. To find out your arrival time, please call [phone] between 1PM - 3PM on: 09/20/23. If your arrival time is 6:00 am, do not arrive before that time as the [facility] Mall entrance doors do not open until 6:00 am.  REMEMBER: Instructions that are not followed completely may result in serious medical risk, up to and including death; or upon the discretion of your surgeon and anesthesiologist your surgery may need to be rescheduled.  Do not eat food or drink any liquids after midnight the night before surgery.  No gum chewing or hard candies.  HOLD ELIQUIS beginning Oct 09, 2023,  One week prior to surgery: Stop Anti-inflammatories (NSAIDS) such as Advil, Aleve, Ibuprofen, Motrin, Naproxen, Naprosyn and Aspirin based products such as Excedrin, Goody's Powder, BC Powder. You may take Tylenol  if needed for pain up until the day of surgery.  Stop ANY OVER THE COUNTER supplements until after surgery.  ON THE DAY OF SURGERY ONLY TAKE THESE MEDICATIONS WITH SIPS OF WATER:  omeprazole (PRILOSEC OTC)    No Alcohol for 24 hours before or after surgery.  No Smoking including e-cigarettes for 24 hours before surgery.  No chewable tobacco products for at least 6 hours before surgery.  No nicotine patches on the day of surgery.  Do not use any recreational drugs for at least a week (preferably 2 weeks) before your surgery.  Please be advised that the combination of cocaine and anesthesia may have negative outcomes, up to and including death. If you test positive for cocaine, your surgery will be cancelled.  On the morning of surgery brush your teeth with toothpaste and water, you may rinse your mouth with mouthwash if you wish. Do not swallow any toothpaste or mouthwash.  Do not wear jewelry, make-up, hairpins, clips or nail polish.  For welded (permanent) jewelry: bracelets,  anklets, waist bands, etc.  Please have this removed prior to surgery.  If it is not removed, there is a chance that hospital personnel will need to cut it off on the day of surgery.  Do not wear lotions, powders, or perfumes.   Do not shave body hair from the neck down 48 hours before surgery.  Contact lenses, hearing aids and dentures may not be worn into surgery.  Do not bring valuables to the hospital. [facility] is not responsible for any missing/lost belongings or valuables.   Notify your doctor if there is any change in your medical condition (cold, fever, infection).  Wear comfortable clothing (specific to your surgery type) to the hospital.  After surgery, you can help prevent lung complications by doing breathing exercises.  Take deep breaths and cough every 1-2 hours. Your doctor may order a device called an Incentive Spirometer to help you take deep breaths.  When coughing or sneezing, hold a pillow firmly against your incision with both hands. This is called "splinting." Doing this helps protect your incision. It also decreases belly discomfort.  If you are being admitted to the hospital overnight, leave your suitcase in the car. After surgery it may be brought to your room.  In case of increased patient census, it may be necessary for you, the patient, to continue your postoperative care in the Same Day Surgery department.  If you are being discharged the day of surgery, you will not be allowed to drive home. You will need a responsible individual to drive you home  and stay with you for 24 hours after surgery.   If you are taking public transportation, you will need to have a responsible individual with you.  Please call the Pre-admissions Testing Dept. at [phone] if you have any questions about these instructions.  Surgery Visitation Policy:  Patients having surgery or a procedure may have two visitors.  Children under the age of 83 must have an adult with them  who is not the patient.  Inpatient Visitation:    Visiting hours are 7 a.m. to 8 p.m. Up to four visitors are allowed at one time in a patient room. The visitors may rotate out with other people during the day.  One visitor age 57 or older may stay with the patient overnight and must be in the room by 8 p.m.   Merchandiser, retail to address health-related social needs:  [email or web address]

## 2023-09-20 NOTE — Anesthesia Preprocedure Evaluation (Signed)
 Anesthesia Evaluation  Patient identified by MRN, date of birth, ID band Patient awake    Reviewed: Allergy & Precautions, H&P , NPO status , Patient's Chart, lab work & pertinent test results  Airway Mallampati: II  TM Distance: >3 FB Neck ROM: full    Dental no notable dental hx.    Pulmonary asthma    Pulmonary exam normal        Cardiovascular Normal cardiovascular exam  DVT 2004   Neuro/Psych negative neurological ROS  negative psych ROS   GI/Hepatic negative GI ROS, Neg liver ROS,,,  Endo/Other  negative endocrine ROS    Renal/GU      Musculoskeletal   Abdominal Normal abdominal exam  (+)   Peds  Hematology  (+) Blood dyscrasia Prothrombin gene mutation    Anesthesia Other Findings Past Medical History: No date: Allergic rhinitis No date: Asthma No date: B12 deficiency No date: Colon polyps 2004: DVT (deep vein thrombosis) No date: GERD (gastroesophageal reflux disease) No date: Laryngopharyngeal reflux (LPR) No date: On apixaban therapy No date: Prothrombin gene mutation (Factor II)     Comment:  a.) DVT treated with warfarin x 13 years prior to               transitioning over to chronic DOAC (apixaban)  Past Surgical History: No date: COLONOSCOPY 12/07/2016: COLONOSCOPY WITH PROPOFOL ; N/A     Comment:  Procedure: COLONOSCOPY WITH PROPOFOL ;  Surgeon: Viktoria Lamar DASEN, MD;  Location: [facility] ENDOSCOPY;  Service:               Endoscopy;  Laterality: N/A; 07/20/2020: COLONOSCOPY WITH PROPOFOL ; N/A     Comment:  Procedure: COLONOSCOPY WITH PROPOFOL ;  Surgeon: Toledo,               Ladell POUR, MD;  Location: [facility] ENDOSCOPY;  Service:               Gastroenterology;  Laterality: N/A; No date: FLEXIBLE SIGMOIDOSCOPY No date: FRACTURE SURGERY; Right     Comment:  thumb No date: HERNIA REPAIR No date: NASAL SINUS SURGERY 06/08/2021: SHOULDER ARTHROSCOPY WITH SUBACROMIAL DECOMPRESSION,   ROTATOR CUFF REPAIR AND BICEP TENDON REPAIR; Left     Comment:  Procedure: LEFT SHOULDER ARTHROSCOPY WITH DEBRIDEMENT,               DECOMPRESSION, DISTAL CLAVICLE EXCISION, ROTATOR CUFF               REPAIR, AND BICEPS TENODESIS;  Surgeon: Edie Norleen PARAS,               MD;  Location: [facility] ORS;  Service: Orthopedics;                Laterality: Left;     Reproductive/Obstetrics negative OB ROS                              Anesthesia Physical Anesthesia Plan  ASA: 2  Anesthesia Plan: General ETT   Post-op Pain Management: Tylenol  PO (pre-op)* and Celebrex PO (pre-op)*   Induction: Intravenous  PONV Risk Score and Plan: 2 and Ondansetron , Dexamethasone  and Midazolam   Airway Management Planned: Oral ETT  Additional Equipment:   Intra-op Plan:   Post-operative Plan: Extubation in OR  Informed Consent: I have reviewed the patients History and Physical, chart, labs and discussed the procedure including the risks, benefits and  alternatives for the proposed anesthesia with the patient or authorized representative who has indicated his/her understanding and acceptance.     Dental Advisory Given  Plan Discussed with: CRNA and Surgeon  Anesthesia Plan Comments:          Anesthesia Quick Evaluation

## 2023-09-23 ENCOUNTER — Encounter: Payer: Self-pay | Admitting: Urgent Care

## 2023-09-23 ENCOUNTER — Encounter: Payer: Self-pay | Admitting: General Surgery

## 2023-09-23 ENCOUNTER — Encounter
Admission: RE | Disposition: A | Discharge status: Home / Self Care | Payer: Self-pay | Source: Home / Self Care | Attending: General Surgery

## 2023-09-23 ENCOUNTER — Ambulatory Visit
Admission: RE | Admit: 2023-09-23 | Discharge: 2023-09-23 | Disposition: A | Discharge status: Home / Self Care | Attending: General Surgery | Admitting: General Surgery

## 2023-09-23 ENCOUNTER — Other Ambulatory Visit: Payer: Self-pay

## 2023-09-23 ENCOUNTER — Ambulatory Visit: Payer: Self-pay | Admitting: Anesthesiology

## 2023-09-23 DIAGNOSIS — Z86718 Personal history of other venous thrombosis and embolism: Secondary | ICD-10-CM | POA: Insufficient documentation

## 2023-09-23 DIAGNOSIS — K4090 Unilateral inguinal hernia, without obstruction or gangrene, not specified as recurrent: Secondary | ICD-10-CM | POA: Insufficient documentation

## 2023-09-23 DIAGNOSIS — Z87891 Personal history of nicotine dependence: Secondary | ICD-10-CM | POA: Diagnosis not present

## 2023-09-23 DIAGNOSIS — D6852 Prothrombin gene mutation: Secondary | ICD-10-CM | POA: Insufficient documentation

## 2023-09-23 HISTORY — PX: XI ROBOTIC ASSISTED INGUINAL HERNIA REPAIR WITH MESH: SHX6706

## 2023-09-23 SURGERY — REPAIR, HERNIA, INGUINAL, ROBOT-ASSISTED, LAPAROSCOPIC, USING MESH
Anesthesia: General | Site: Inguinal | Laterality: Right

## 2023-09-23 MED ORDER — DEXAMETHASONE SODIUM PHOSPHATE 10 MG/ML IJ SOLN
INTRAMUSCULAR | Status: DC | PRN
  Administered 2023-09-23: 10 mg via INTRAVENOUS

## 2023-09-23 MED ORDER — LACTATED RINGERS IV SOLN: INTRAVENOUS | Status: DC

## 2023-09-23 MED ORDER — LACTATED RINGERS IV SOLN: INTRAVENOUS | Status: DC | PRN

## 2023-09-23 MED ORDER — BUPIVACAINE-EPINEPHRINE (PF) 0.25% -1:200000 IJ SOLN
INTRAMUSCULAR | Status: AC
  Filled 2023-09-23: qty 30

## 2023-09-23 MED ORDER — DROPERIDOL 2.5 MG/ML IJ SOLN: 0.6250 mg | Freq: Once | INTRAMUSCULAR | Status: DC | PRN

## 2023-09-23 MED ORDER — CEFAZOLIN SODIUM-DEXTROSE 2-4 GM/100ML-% IV SOLN
2.0000 g | INTRAVENOUS | Status: AC
  Administered 2023-09-23: 2 g via INTRAVENOUS

## 2023-09-23 MED ORDER — ACETAMINOPHEN 10 MG/ML IV SOLN: 1000.0000 mg | Freq: Once | INTRAVENOUS | Status: DC | PRN

## 2023-09-23 MED ORDER — MIDAZOLAM HCL 2 MG/2ML IJ SOLN
INTRAMUSCULAR | Status: DC | PRN
  Administered 2023-09-23: 2 mg via INTRAVENOUS

## 2023-09-23 MED ORDER — PROPOFOL 10 MG/ML IV BOLUS
INTRAVENOUS | Status: AC
  Filled 2023-09-23: qty 20

## 2023-09-23 MED ORDER — ORAL CARE MOUTH RINSE: 15.0000 mL | Freq: Once | OROMUCOSAL | Status: AC

## 2023-09-23 MED ORDER — CHLORHEXIDINE GLUCONATE 0.12 % MT SOLN
OROMUCOSAL | Status: AC
  Filled 2023-09-23: qty 15

## 2023-09-23 MED ORDER — LIDOCAINE HCL (PF) 2 % IJ SOLN
INTRAMUSCULAR | Status: AC
  Filled 2023-09-23: qty 5

## 2023-09-23 MED ORDER — ONDANSETRON HCL 4 MG/2ML IJ SOLN
INTRAMUSCULAR | Status: AC
  Filled 2023-09-23: qty 2

## 2023-09-23 MED ORDER — BUPIVACAINE-EPINEPHRINE 0.25% -1:200000 IJ SOLN
INTRAMUSCULAR | Status: DC | PRN
  Administered 2023-09-23: 30 mL

## 2023-09-23 MED ORDER — ONDANSETRON HCL 4 MG/2ML IJ SOLN
INTRAMUSCULAR | Status: DC | PRN
  Administered 2023-09-23: 4 mg via INTRAVENOUS

## 2023-09-23 MED ORDER — PROPOFOL 10 MG/ML IV BOLUS
INTRAVENOUS | Status: DC | PRN
Start: 2023-09-23 — End: 2023-09-23
  Administered 2023-09-23: 160 mg via INTRAVENOUS
  Administered 2023-09-23: 40 mg via INTRAVENOUS

## 2023-09-23 MED ORDER — FENTANYL CITRATE (PF) 100 MCG/2ML IJ SOLN
INTRAMUSCULAR | Status: AC
  Filled 2023-09-23: qty 2

## 2023-09-23 MED ORDER — ACETAMINOPHEN 10 MG/ML IV SOLN
INTRAVENOUS | Status: AC
  Filled 2023-09-23: qty 100

## 2023-09-23 MED ORDER — MIDAZOLAM HCL 2 MG/2ML IJ SOLN
INTRAMUSCULAR | Status: AC
  Filled 2023-09-23: qty 2

## 2023-09-23 MED ORDER — SUGAMMADEX SODIUM 200 MG/2ML IV SOLN
INTRAVENOUS | Status: DC | PRN
  Administered 2023-09-23: 200 mg via INTRAVENOUS

## 2023-09-23 MED ORDER — LIDOCAINE HCL (CARDIAC) PF 100 MG/5ML IV SOSY
PREFILLED_SYRINGE | INTRAVENOUS | Status: DC | PRN
  Administered 2023-09-23: 100 mg via INTRAVENOUS

## 2023-09-23 MED ORDER — OXYCODONE HCL 5 MG PO TABS
ORAL_TABLET | ORAL | Status: AC
  Filled 2023-09-23: qty 1

## 2023-09-23 MED ORDER — OXYCODONE HCL 5 MG PO TABS
5.0000 mg | ORAL_TABLET | Freq: Once | ORAL | Status: AC | PRN
  Administered 2023-09-23: 5 mg via ORAL

## 2023-09-23 MED ORDER — PHENYLEPHRINE 80 MCG/ML (10ML) SYRINGE FOR IV PUSH (FOR BLOOD PRESSURE SUPPORT)
PREFILLED_SYRINGE | INTRAVENOUS | Status: AC
  Filled 2023-09-23: qty 10

## 2023-09-23 MED ORDER — ROCURONIUM BROMIDE 10 MG/ML (PF) SYRINGE
PREFILLED_SYRINGE | INTRAVENOUS | Status: AC
  Filled 2023-09-23: qty 10

## 2023-09-23 MED ORDER — FENTANYL CITRATE (PF) 100 MCG/2ML IJ SOLN
INTRAMUSCULAR | Status: AC
Start: 2023-09-23 — End: 2023-09-23
  Filled 2023-09-23: qty 2

## 2023-09-23 MED ORDER — CEFAZOLIN SODIUM-DEXTROSE 2-4 GM/100ML-% IV SOLN
INTRAVENOUS | Status: AC
  Filled 2023-09-23: qty 100

## 2023-09-23 MED ORDER — HYDROCODONE-ACETAMINOPHEN 5-325 MG PO TABS: 1.0000 | ORAL_TABLET | Freq: Four times a day (QID) | ORAL | 0 refills | Status: AC | PRN

## 2023-09-23 MED ORDER — FENTANYL CITRATE (PF) 100 MCG/2ML IJ SOLN
INTRAMUSCULAR | Status: DC | PRN
  Administered 2023-09-23 (×2): 50 ug via INTRAVENOUS

## 2023-09-23 MED ORDER — CHLORHEXIDINE GLUCONATE 0.12 % MT SOLN
15.0000 mL | Freq: Once | OROMUCOSAL | Status: AC
  Administered 2023-09-23: 15 mL via OROMUCOSAL

## 2023-09-23 MED ORDER — OXYCODONE HCL 5 MG/5ML PO SOLN: 5.0000 mg | Freq: Once | ORAL | Status: AC | PRN

## 2023-09-23 MED ORDER — DEXAMETHASONE SODIUM PHOSPHATE 10 MG/ML IJ SOLN
INTRAMUSCULAR | Status: AC
  Filled 2023-09-23: qty 1

## 2023-09-23 MED ORDER — ACETAMINOPHEN 10 MG/ML IV SOLN
INTRAVENOUS | Status: DC | PRN
  Administered 2023-09-23: 1000 mg via INTRAVENOUS

## 2023-09-23 MED ORDER — 0.9 % SODIUM CHLORIDE (POUR BTL) OPTIME
TOPICAL | Status: DC | PRN
  Administered 2023-09-23: 500 mL

## 2023-09-23 MED ORDER — FENTANYL CITRATE (PF) 100 MCG/2ML IJ SOLN
25.0000 ug | INTRAMUSCULAR | Status: AC | PRN
  Administered 2023-09-23 (×6): 25 ug via INTRAVENOUS

## 2023-09-23 MED ORDER — ROCURONIUM BROMIDE 100 MG/10ML IV SOLN
INTRAVENOUS | Status: DC | PRN
  Administered 2023-09-23: 60 mg via INTRAVENOUS
  Administered 2023-09-23: 20 mg via INTRAVENOUS

## 2023-09-23 SURGICAL SUPPLY — 35 items
BAG PRESSURE INF REUSE 1000 (BAG) IMPLANT
COVER TIP SHEARS 8 DVNC (MISCELLANEOUS) ×2 IMPLANT
COVER WAND RF STERILE (DRAPES) ×2 IMPLANT
DEFOGGER SCOPE WARM SEASHARP (MISCELLANEOUS) ×2 IMPLANT
DERMABOND ADVANCED .7 DNX12 (GAUZE/BANDAGES/DRESSINGS) ×2 IMPLANT
DRAPE ARM DVNC X/XI (DISPOSABLE) ×6 IMPLANT
DRAPE COLUMN DVNC XI (DISPOSABLE) ×2 IMPLANT
ELECTRODE REM PT RTRN 9FT ADLT (ELECTROSURGICAL) ×2 IMPLANT
FORCEPS BPLR FENES DVNC XI (FORCEP) ×2 IMPLANT
GLOVE BIO SURGEON STRL SZ 6.5 (GLOVE) ×4 IMPLANT
GLOVE BIOGEL PI IND STRL 6.5 (GLOVE) ×4 IMPLANT
GLOVE SURG SYN 6.5 PF PI (GLOVE) ×4 IMPLANT
GOWN STRL REUS W/ TWL LRG LVL3 (GOWN DISPOSABLE) ×8 IMPLANT
IRRIGATOR SUCT 8 DISP DVNC XI (IRRIGATION / IRRIGATOR) IMPLANT
IV CATH ANGIO 12GX3 LT BLUE (NEEDLE) ×1 IMPLANT
IV NS 1000ML BAXH (IV SOLUTION) IMPLANT
KIT PINK PAD W/HEAD ARM REST (MISCELLANEOUS) ×2 IMPLANT
MESH 3DMAX MID 5X7 RT XLRG (Mesh General) ×1 IMPLANT
NDL DRIVE SUT CUT DVNC (INSTRUMENTS) ×1 IMPLANT
NDL HYPO 22X1.5 SAFETY MO (MISCELLANEOUS) ×1 IMPLANT
NDL INSUFFLATION 14GA 120MM (NEEDLE) ×1 IMPLANT
NEEDLE DRIVE SUT CUT DVNC (INSTRUMENTS) ×2 IMPLANT
NEEDLE HYPO 22X1.5 SAFETY MO (MISCELLANEOUS) ×2 IMPLANT
NEEDLE INSUFFLATION 14GA 120MM (NEEDLE) ×2 IMPLANT
NS IRRIG 500ML POUR BTL (IV SOLUTION) ×1 IMPLANT
OBTURATOR OPTICALSTD 8 DVNC (TROCAR) ×2 IMPLANT
PACK LAP CHOLECYSTECTOMY (MISCELLANEOUS) ×2 IMPLANT
SCISSORS MNPLR CVD DVNC XI (INSTRUMENTS) ×2 IMPLANT
SEAL UNIV 5-12 XI (MISCELLANEOUS) ×6 IMPLANT
SET TUBE SMOKE EVAC HIGH FLOW (TUBING) ×2 IMPLANT
SOLUTION ELECTROSURG ANTI STCK (MISCELLANEOUS) ×2 IMPLANT
SUT STRATA 2-0 23CM CT-2 (SUTURE) ×2 IMPLANT
SUT VIC AB 2-0 SH 27XBRD (SUTURE) ×2 IMPLANT
SUTURE MNCRL 4-0 27XMF (SUTURE) ×2 IMPLANT
TAPE TRANSPORE STRL 2 31045 (GAUZE/BANDAGES/DRESSINGS) IMPLANT

## 2023-09-23 NOTE — Anesthesia Postprocedure Evaluation (Signed)
 Anesthesia Post Note  Patient: George Sharp  Procedure(s) Performed: REPAIR, HERNIA, INGUINAL, ROBOT-ASSISTED, LAPAROSCOPIC, USING MESH (Right: Inguinal)  Patient location during evaluation: PACU Anesthesia Type: General Level of consciousness: awake and alert Pain management: pain level controlled Vital Signs Assessment: post-procedure vital signs reviewed and stable Respiratory status: spontaneous breathing, nonlabored ventilation and respiratory function stable Cardiovascular status: blood pressure returned to baseline and stable Postop Assessment: no apparent nausea or vomiting Anesthetic complications: no   There were no known notable events for this encounter.   Last Vitals:  Vitals:   09/23/23 1000 09/23/23 1018  BP: 107/72 112/72  Pulse: 64 62  Resp: 15 15  Temp: 36.6 C 36.4 C  SpO2: 97% 100%    Last Pain:  Vitals:   09/23/23 1018  TempSrc:   PainSc: 5                  Camellia Merilee Louder

## 2023-09-23 NOTE — Anesthesia Procedure Notes (Signed)
 Procedure Name: Intubation Date/Time: 09/23/2023 7:41 AM  Performed by: Brien Sotero PARAS, CRNAPre-anesthesia Checklist: Patient identified, Emergency Drugs available, Suction available and Patient being monitored Patient Re-evaluated:Patient Re-evaluated prior to induction Oxygen Delivery Method: Circle system utilized Preoxygenation: Pre-oxygenation with 100% oxygen Induction Type: IV induction Ventilation: Mask ventilation without difficulty and Oral airway inserted - appropriate to patient size Laryngoscope Size: McGrath and 4 Grade View: Grade II Tube type: Oral Tube size: 7.5 mm Number of attempts: 1 Airway Equipment and Method: Stylet and Oral airway Placement Confirmation: ETT inserted through vocal cords under direct vision, positive ETCO2 and breath sounds checked- equal and bilateral Secured at: 22 cm Tube secured with: Tape Dental Injury: Teeth and Oropharynx as per pre-operative assessment

## 2023-09-23 NOTE — Discharge Instructions (Signed)

## 2023-09-23 NOTE — Interval H&P Note (Signed)
 History and Physical Interval Note:  09/23/2023 7:22 AM  George Sharp  has presented today for surgery, with the diagnosis of K40.90 non recurrent unilateral inguinal hernia w/o obstruction or gangrene.  The various methods of treatment have been discussed with the patient and family. After consideration of risks, benefits and other options for treatment, the patient has consented to  Procedure(s): REPAIR, HERNIA, INGUINAL, BILATERAL, ROBOT-ASSISTED (Bilateral) INSERTION OF MESH (N/A) as a surgical intervention.  The patient's history has been reviewed, patient examined, no change in status, stable for surgery.  I have reviewed the patient's chart and labs.  Questions were answered to the patient's satisfaction.     Lucas Sjogren

## 2023-09-23 NOTE — Op Note (Signed)
 Preoperative diagnosis: Right inguinal hernia.   Postoperative diagnosis: Right inguinal hernia.  Procedure: Robotic assisted Laparoscopic Transabdominal preperitoneal laparoscopic (TAPP) repair of right inguinal hernia.  Anesthesia: GETA  Surgeon: Dr. Cesar Coe  Wound Classification: Clean  Indications:  Patient is a 63 y.o. male developed a symptomatic right inguinal hernia. Repair was indicated.  Findings: 1. Pantaloon Right Inguinal hernia identified 2. Vas deferens and cord structures identified and preserved 3. Bard Extra Large 3D Max MID Anatomical mesh used for repair 4. Adequate hemostasis.        Description of procedure:  The patient was taken to the operating room and the correct side of surgery was verified. The patient was placed supine with right arm tucked at the side. After obtaining adequate anesthesia, the patient's abdomen was prepped and draped in standard sterile fashion. A time-out was completed verifying correct patient, procedure, site, positioning, and implant(s) and/or special equipment prior to beginning this procedure.  An incision was made in a natural skin line above the umbilicus. The fascia was elevated and the Veress needle inserted. Proper position was confirmed by aspiration and saline meniscus test.  The abdomen was insufflated with carbon dioxide to a pressure of 15 mmHg. The patient tolerated insufflation well.  Abdominal cavity was entered using Optiview technique with a millimeter trocar.  No injury was identified.  Another 2 mm trocars were placed lateral to each rectus muscle.  Scissors and bipolar forceps were inserted under direct visualization. At the robotic console: Transverse peritoneal incision is made about 8 cm superior to the inguinal defect. Medial to the epigastric vessels, the parietal compartment is dissected to visualize the rectus muscle. This is carried down to the symphysis pubis and the retropubic space is dissected to  expose at least 2 cm contralateral to the midline. Cooper's ligament is exposed and cleared at least 2 cm below the ligament to ensure adequate space for the inferior border of the mesh. Hesselbach's triangle is cleared assessing for a direct hernia. The hernia is reduced dissecting the contents away from the border of the transversalis ([place]) fascia. Lateral to the epigastric vessels, the dissection is carried out in visceral compartment continuing in the true preperitoneal plane. Indirect hernia sac, was carefully reduced and separated from the cord structures with medial retraction and a combination of blunt/sharp dissection and focused cautery. This dissection was continued until the cord structures are "parietalized" completely, allowing for visualization of the reflected peritoneum that is continuous with the line originating 2 cm below Coopers medially and across the psoas muscle in the lateral compartment.  The internal ring was interrogated for a cord lipoma. The cord lipoma was reduced to the retroperitoneum and seated dorsal to the preperitoneal mesh. Having achieved a complete dissection with a critical view of the entire myopectineal orifice, an XL mesh was then positioned centered at the iliopubic tract with the medial side crossing the midline and the inferior edge positioned 2 cm below Coopers ligament. The lateral aspect of the mesh extended 3-5 cm beyond the lateral edge of the psoas. The mesh is fixated using an interrupted suture placed to the ipsilateral Coopers ligament. A second suture was done at the medial superior aspect of the mesh fixating this to the rectus complex.  The peritoneal flap is closed with running barbed suture. Additional holes in the peritoneum closed with suture. Preperitoneal space gas aspirated to visualize the peritoneum apposed directly against the mesh and ensure no folding, lifting, or buckling of the mesh. Skin is closed,  sterile dressings are applied.  The  patient tolerated the procedure well and was taken to the postanesthesia care unit in stable condition  Specimen: None  Complications: None  Estimated Blood Loss: 5 mL

## 2023-09-23 NOTE — Transfer of Care (Signed)
 Immediate Anesthesia Transfer of Care Note  Patient: George Sharp  Procedure(s) Performed: REPAIR, HERNIA, INGUINAL, ROBOT-ASSISTED, LAPAROSCOPIC, USING MESH (Right: Inguinal)  Patient Location: PACU  Anesthesia Type:General  Level of Consciousness: awake, drowsy, and patient cooperative  Airway & Oxygen Therapy: Patient Spontanous Breathing  Post-op Assessment: Report given to RN and Post -op Vital signs reviewed and stable  Post vital signs: Reviewed and stable  Last Vitals:  Vitals Value Taken Time  BP 129/82 09/23/23 09:10  Temp    Pulse 75 09/23/23 09:13  Resp 14 09/23/23 09:13  SpO2 97 % 09/23/23 09:13  Vitals shown include unfiled device data.  Last Pain:  Vitals:   09/23/23 0639  TempSrc: Temporal  PainSc:          Complications: There were no known notable events for this encounter.

## 2023-09-24 ENCOUNTER — Encounter: Payer: Self-pay | Admitting: General Surgery

## 2023-09-24 ENCOUNTER — Encounter (HOSPITAL_COMMUNITY): Payer: Self-pay

## 2023-09-24 ENCOUNTER — Ambulatory Visit (HOSPITAL_COMMUNITY)

## 2023-09-25 ENCOUNTER — Other Ambulatory Visit

## 2023-09-27 ENCOUNTER — Encounter: Payer: Self-pay | Admitting: General Surgery

## 2023-11-01 ENCOUNTER — Other Ambulatory Visit: Payer: Self-pay | Admitting: Radiology

## 2023-11-01 DIAGNOSIS — R7989 Other specified abnormal findings of blood chemistry: Secondary | ICD-10-CM

## 2023-11-01 NOTE — H&P (Signed)
 Chief Complaint: Elevated liver enzymes; referred for image guided random core liver biopsy for further evaluation  Referring Provider(s): Pyrtle,J  Supervising Physician: Jenna Hacker  Patient Status: [facility] - Out-pt  History of Present Illness: George Sharp is a 63 y.o. male with past medical history significant for asthma, vitamin B12/D deficiency, colon polyps, DVT on Eliquis, GERD, laryngopharyngeal reflux, prothrombin gene mutation factor II,  and intermittently elevated liver enzymes of uncertain etiology.  He presents today for image guided random core liver biopsy for further evaluation.   Patient is Full Code  Past Medical History:  Diagnosis Date   Allergic rhinitis    Asthma    B12 deficiency    Colon polyps    DVT (deep vein thrombosis) 2004   GERD (gastroesophageal reflux disease)    Laryngopharyngeal reflux (LPR)    On apixaban therapy    Prothrombin gene mutation (Factor II)    a.) DVT treated with warfarin x 13 years prior to transitioning over to chronic DOAC (apixaban)    Past Surgical History:  Procedure Laterality Date   COLONOSCOPY     COLONOSCOPY WITH PROPOFOL  N/A 12/07/2016   Procedure: COLONOSCOPY WITH PROPOFOL ;  Surgeon: Viktoria Lamar DASEN, MD;  Location: [facility] ENDOSCOPY;  Service: Endoscopy;  Laterality: N/A;   COLONOSCOPY WITH PROPOFOL  N/A 07/20/2020   Procedure: COLONOSCOPY WITH PROPOFOL ;  Surgeon: Toledo, Ladell POUR, MD;  Location: [facility] ENDOSCOPY;  Service: Gastroenterology;  Laterality: N/A;   FLEXIBLE SIGMOIDOSCOPY     FRACTURE SURGERY Right    thumb   HERNIA REPAIR     NASAL SINUS SURGERY     SHOULDER ARTHROSCOPY WITH SUBACROMIAL DECOMPRESSION, ROTATOR CUFF REPAIR AND BICEP TENDON REPAIR Left 06/08/2021   Procedure: LEFT SHOULDER ARTHROSCOPY WITH DEBRIDEMENT, DECOMPRESSION, DISTAL CLAVICLE EXCISION, ROTATOR CUFF REPAIR, AND BICEPS TENODESIS;  Surgeon: Edie Norleen PARAS, MD;  Location: [facility] ORS;  Service: Orthopedics;  Laterality: Left;   XI  ROBOTIC ASSISTED INGUINAL HERNIA REPAIR WITH MESH Right 09/23/2023   Procedure: REPAIR, HERNIA, INGUINAL, ROBOT-ASSISTED, LAPAROSCOPIC, USING MESH;  Surgeon: Rodolph Romano, MD;  Location: [facility] ORS;  Service: General;  Laterality: Right;    Allergies: Patient has no known allergies.  Medications: Prior to Admission medications   Medication Sig Start Date End Date Taking? Authorizing Provider  acetaminophen  (TYLENOL ) 500 MG tablet Take 500 mg by mouth every 6 (six) hours as needed for moderate pain, mild pain or headache.    [provider]  cetirizine (ZYRTEC) 10 MG tablet Take 10 mg by mouth daily.    [provider]  Cholecalciferol 25 MCG (1000 UT) tablet Take 1,000 Units by mouth once a week.    [provider]  ELIQUIS 5 MG TABS tablet Take 5 mg by mouth 2 (two) times daily. 05/19/21   [provider]  omeprazole (PRILOSEC OTC) 20 MG tablet Take 20 mg by mouth as needed.    [provider]  vitamin B-12 (CYANOCOBALAMIN ) 1000 MCG tablet Take 1,000 mcg by mouth once a week.    [provider]     Family History  Problem Relation Age of Onset   Emphysema Mother    Aortic aneurysm Father    Colon cancer Neg Hx    Colon polyps Neg Hx    Esophageal cancer Neg Hx    Pancreatic cancer Neg Hx    Stomach cancer Neg Hx     Social History   Socioeconomic History   Marital status: Married    Spouse name: Oskar, Cretella (  Spouse)   Number of children: Not on file   Years of education: Not on file   Highest education level: Not on file  Occupational History   Not on file  Tobacco Use   Smoking status: Never   Smokeless tobacco: Never  Vaping Use   Vaping status: Never Used  Substance and Sexual Activity   Alcohol use: Not Currently   Drug use: No   Sexual activity: Not on file  Other Topics Concern   Not on file  Social History Narrative   Lives together with wife    2 daughters   Social Drivers of Health    Financial Resource Strain: Low Risk  (10/03/2023)   Received from [facility]   Overall Financial Resource Strain (CARDIA)    Difficulty of Paying Living Expenses: Not hard at all  Food Insecurity: No Food Insecurity (10/03/2023)   Received from [facility]   Hunger Vital Sign    Within the past 12 months, you worried that your food would run out before you got the money to buy more.: Never true    Within the past 12 months, the food you bought just didn't last and you didn't have money to get more.: Never true  Transportation Needs: No Transportation Needs (10/03/2023)   Received from [facility] - Transportation    In the past 12 months, has lack of transportation kept you from medical appointments or from getting medications?: No    Lack of Transportation (Non-Medical): No  Physical Activity: Not on file  Stress: Not on file  Social Connections: Not on file       Review of Systems: Denies fever, headache, chest pain, dyspnea, cough, abdominal/back pain, nausea, vomiting or bleeding  Vital Signs: Temp 98.2, blood pressure 116/73, heart rate 63, respirations 18, O2 sat 98% room air    Advance Care Plan: No documents on file.  Physical Exam: Awake, alert.  Chest clear to auscultation bilaterally.  Heart with regular rate and rhythm.  Abdomen soft, positive bowel sounds, nontender.  No lower extremity edema.  Imaging: No results found.  Labs:  CBC: No results for input(s): WBC, HGB, HCT, PLT in the last 8760 hours.  COAGS: No results for input(s): INR, APTT in the last 8760 hours.  BMP: No results for input(s): NA, K, CL, CO2, GLUCOSE, BUN, CALCIUM, CREATININE, GFRNONAA, GFRAA in the last 8760 hours.  Invalid input(s): CMP  LIVER FUNCTION TESTS: Recent Labs    08/27/23 1102  BILITOT 0.7  AST 55*  ALT 76*  ALKPHOS 59  PROT 7.3  ALBUMIN 4.4    TUMOR MARKERS: No  results for input(s): AFPTM, CEA, CA199, CHROMGRNA in the last 8760 hours.  Assessment and Plan: 63 y.o. male with past medical history significant for asthma, vitamin B12/D deficiency, colon polyps, DVT on Eliquis, GERD, laryngopharyngeal reflux, prothrombin gene mutation factor II,  and intermittently elevated liver enzymes of uncertain etiology.  He presents today for image guided random core liver biopsy for further evaluation.Risks and benefits of procedure was discussed with the patient/spouse  including, but not limited to bleeding, infection, damage to adjacent structures or low yield requiring additional tests.  All of the questions were answered and there is agreement to proceed.  Consent signed and in chart.    Thank you for allowing our service to participate in CHADRIC KIMBERLEY 's care.  Electronically Signed: D. Franky Rakers, PA-C   11/01/2023, 4:02 PM  I spent a total of  25 minutes   in face to face in clinical consultation, greater than 50% of which was counseling/coordinating care for image guided random core liver biopsy

## 2023-11-04 ENCOUNTER — Ambulatory Visit (HOSPITAL_COMMUNITY)
Admission: RE | Admit: 2023-11-04 | Discharge: 2023-11-04 | Disposition: A | Discharge status: Home / Self Care | Source: Ambulatory Visit | Attending: Internal Medicine | Admitting: Internal Medicine

## 2023-11-04 ENCOUNTER — Other Ambulatory Visit: Payer: Self-pay

## 2023-11-04 ENCOUNTER — Encounter (HOSPITAL_COMMUNITY): Payer: Self-pay

## 2023-11-04 DIAGNOSIS — D6852 Prothrombin gene mutation: Secondary | ICD-10-CM | POA: Insufficient documentation

## 2023-11-04 DIAGNOSIS — Z7901 Long term (current) use of anticoagulants: Secondary | ICD-10-CM | POA: Diagnosis not present

## 2023-11-04 DIAGNOSIS — J45909 Unspecified asthma, uncomplicated: Secondary | ICD-10-CM | POA: Insufficient documentation

## 2023-11-04 DIAGNOSIS — R7989 Other specified abnormal findings of blood chemistry: Secondary | ICD-10-CM

## 2023-11-04 DIAGNOSIS — R748 Abnormal levels of other serum enzymes: Secondary | ICD-10-CM | POA: Diagnosis present

## 2023-11-04 DIAGNOSIS — Z860100 Personal history of colon polyps, unspecified: Secondary | ICD-10-CM | POA: Insufficient documentation

## 2023-11-04 DIAGNOSIS — K219 Gastro-esophageal reflux disease without esophagitis: Secondary | ICD-10-CM | POA: Diagnosis not present

## 2023-11-04 DIAGNOSIS — Z86718 Personal history of other venous thrombosis and embolism: Secondary | ICD-10-CM | POA: Insufficient documentation

## 2023-11-04 HISTORY — PX: IR US LIVER BIOPSY: IMG936

## 2023-11-04 LAB — COMPREHENSIVE METABOLIC PANEL WITH GFR
ALT: 106 U/L — ABNORMAL HIGH (ref 0–44)
AST: 70 U/L — ABNORMAL HIGH (ref 15–41)
Albumin: 4.4 g/dL (ref 3.5–5.0)
Alkaline Phosphatase: 65 U/L (ref 38–126)
Anion gap: 9 (ref 5–15)
BUN: 8 mg/dL (ref 8–23)
CO2: 25 mmol/L (ref 22–32)
Calcium: 9.9 mg/dL (ref 8.9–10.3)
Chloride: 106 mmol/L (ref 98–111)
Creatinine, Ser: 0.77 mg/dL (ref 0.61–1.24)
GFR, Estimated: >60 mL/min (ref >60)
Glucose, Bld: 93 mg/dL (ref 70–99)
Potassium: 4.5 mmol/L (ref 3.5–5.1)
Sodium: 140 mmol/L (ref 135–145)
Total Bilirubin: 0.5 mg/dL (ref 0.0–1.2)
Total Protein: 7.3 g/dL (ref 6.5–8.1)

## 2023-11-04 LAB — CBC WITH DIFFERENTIAL/PLATELET
Abs Immature Granulocytes: 0.01 K/uL (ref 0.00–0.07)
Basophils Absolute: 0.1 K/uL (ref 0.0–0.1)
Basophils Relative: 2 %
Eosinophils Absolute: 0.5 K/uL (ref 0.0–0.5)
Eosinophils Relative: 8 %
HCT: 45.5 % (ref 39.0–52.0)
Hemoglobin: 14.6 g/dL (ref 13.0–17.0)
Immature Granulocytes: 0 %
Lymphocytes Relative: 28 %
Lymphs Abs: 1.6 K/uL (ref 0.7–4.0)
MCH: 31.3 pg (ref 26.0–34.0)
MCHC: 32.1 g/dL (ref 30.0–36.0)
MCV: 97.6 fL (ref 80.0–100.0)
Monocytes Absolute: 0.7 K/uL (ref 0.1–1.0)
Monocytes Relative: 12 %
Neutro Abs: 2.8 K/uL (ref 1.7–7.7)
Neutrophils Relative %: 50 %
Platelets: 206 K/uL (ref 150–400)
RBC: 4.66 MIL/uL (ref 4.22–5.81)
RDW: 13.8 % (ref 11.5–15.5)
WBC: 5.8 K/uL (ref 4.0–10.5)
nRBC: 0 % (ref 0.0–0.2)

## 2023-11-04 LAB — PROTIME-INR
INR: 1 (ref 0.8–1.2)
Prothrombin Time: 13.7 s (ref 11.4–15.2)

## 2023-11-04 MED ORDER — MIDAZOLAM HCL 2 MG/2ML IJ SOLN
INTRAMUSCULAR | Status: AC | PRN
  Administered 2023-11-04: 1 mg via INTRAVENOUS

## 2023-11-04 MED ORDER — FENTANYL CITRATE (PF) 100 MCG/2ML IJ SOLN
INTRAMUSCULAR | Status: AC
  Filled 2023-11-04: qty 2

## 2023-11-04 MED ORDER — FENTANYL CITRATE (PF) 100 MCG/2ML IJ SOLN
INTRAMUSCULAR | Status: AC | PRN
  Administered 2023-11-04: 50 ug via INTRAVENOUS

## 2023-11-04 MED ORDER — LIDOCAINE-EPINEPHRINE 1 %-1:100000 IJ SOLN
INTRAMUSCULAR | Status: AC
  Filled 2023-11-04: qty 1

## 2023-11-04 MED ORDER — LIDOCAINE-EPINEPHRINE 1 %-1:100000 IJ SOLN
20.0000 mL | Freq: Once | INTRAMUSCULAR | Status: AC
Start: 2023-11-04 — End: 2023-11-04
  Administered 2023-11-04: 6 mL via INTRADERMAL

## 2023-11-04 MED ORDER — SODIUM CHLORIDE 0.9 % IV SOLN: INTRAVENOUS | Status: DC

## 2023-11-04 MED ORDER — MIDAZOLAM HCL 2 MG/2ML IJ SOLN
INTRAMUSCULAR | Status: AC
  Filled 2023-11-04: qty 2

## 2023-11-04 NOTE — Discharge Instructions (Addendum)
Discharge Instructions:   Please call Interventional Radiology clinic [phone] with any questions or concerns.  You may remove your dressing and shower tomorrow.  Moderate Conscious Sedation, Adult, Care After This sheet gives you information about how to care for yourself after your procedure. Your health care provider may also give you more specific instructions. If you have problems or questions, contact your health care provider. What can I expect after the procedure? After the procedure, it is common to have: Sleepiness for several hours. Impaired judgment for several hours. Difficulty with balance. Vomiting if you eat too soon. Follow these instructions at home: For the time period you were told by your health care provider: Rest. Do not participate in activities where you could fall or become injured. Do not drive or use machinery. Do not drink alcohol. Do not take sleeping pills or medicines that cause drowsiness. Do not make important decisions or sign legal documents. Do not take care of children on your own. Eating and drinking  Follow the diet recommended by your health care provider. Drink enough fluid to keep your urine pale yellow. If you vomit: Drink water, juice, or soup when you can drink without vomiting. Make sure you have little or no nausea before eating solid foods. General instructions Take over-the-counter and prescription medicines only as told by your health care provider. Have a responsible adult stay with you for the time you are told. It is important to have someone help care for you until you are awake and alert. Do not smoke. Keep all follow-up visits as told by your health care provider. This is important. Contact a health care provider if: You are still sleepy or having trouble with balance after 24 hours. You feel light-headed. You keep feeling nauseous or you keep vomiting. You develop a rash. You have a fever. You have redness or  swelling around the IV site. Get help right away if: You have trouble breathing. You have new-onset confusion at home. Summary After the procedure, it is common to feel sleepy, have impaired judgment, or feel nauseous if you eat too soon. Rest after you get home. Know the things you should not do after the procedure. Follow the diet recommended by your health care provider and drink enough fluid to keep your urine pale yellow. Get help right away if you have trouble breathing or new-onset confusion at home. This information is not intended to replace advice given to you by your health care provider. Make sure you discuss any questions you have with your health care provider. Document Revised: 05/22/2019 Document Reviewed: 12/18/2018 Elsevier Patient Education  2023 Elsevier Inc.   Liver Biopsy, Care After The following information offers guidance on how to care for yourself after your procedure. Your health care provider may also give you more specific instructions. If you have problems or questions, contact your health care provider. What can I expect after the procedure? After your procedure, it is common to: Have pain and soreness in the area where the biopsy was done. Have bruising around the area where the biopsy was done. Feel tired for 1-2 days. Follow these instructions at home: Medicines Take over-the-counter and prescription medicines only as told by your health care provider. If you were prescribed an antibiotic medicine, take it as told by your health care provider. Do not stop taking the antibiotic, even if you start to feel better. Do not take medicines, such as aspirin and ibuprofen, unless your doctor tells you to take them. Ask your   health care provider if the medicine prescribed to you: Requires you to avoid driving or using machinery. Can cause constipation. You may need to take these actions to prevent or treat constipation: Drink enough fluid to keep your urine pale  yellow. Take over-the-counter or prescription medicines. Eat foods that are high in fiber, such as beans, whole grains, and fresh fruits and vegetables. Limit foods that are high in fat and processed sugars, such as fried or sweet foods. Incision care Follow instructions from your health care provider about how to take care of your incisions. Make sure you: Wash your hands with soap and water for at least 20 seconds before and after you change your bandage (dressing). If soap and water are not available, use hand sanitizer. Change your dressing as told by your health care provider. Leave stitches (sutures), skin glue, or adhesive strips in place. These skin closures may need to stay in place for 2 weeks or longer. If adhesive strip edges start to loosen and curl up, you may trim the loose edges. Do not remove adhesive strips completely unless your health care provider tells you to do that. Check your incision areas every day for signs of infection. Check for: Redness, swelling, or more pain. Fluid or blood. Warmth. Pus or a bad smell. Do not take baths, swim, or use a hot tub until your health care provider says it is okay to do so. Activity Rest at home for 1-2 days, or as directed by your health care provider. Avoid sitting for a long time without moving. Get up to take short walks every 1-2 hours. This is important to improve blood flow and breathing. Ask for help if you feel weak or unsteady. Do not lift anything that is heavier than 10 lb (4.5 kg), or the limit that your health care provider tells you, until he or she says that it is safe. Do not play contact sports for 2 weeks after the procedure. Return to your normal activities as told by your health care provider. Ask your health care provider what activities are safe for you. General instructions  Do not drink alcohol in the first week after the procedure. Plan to have a responsible adult care for you for the time you are told after  you leave the hospital or clinic. This is important. It is up to you to get the results of your procedure. Ask your health care provider, or the department that is doing the procedure, when your results will be ready. Keep all follow-up visits. This is important. Contact a health care provider if: You have increased bleeding from an incision. You have redness, swelling, or increasing pain in any incisions. You notice a discharge or a bad smell coming from any of your incisions. You develop a rash. You have a fever or chills. Get help right away if: You develop swelling, bloating, or pain in your abdomen. You become dizzy or faint. You have nausea or vomiting. You have shortness of breath or difficulty breathing. You develop chest pain. You have problems with your speech or vision. You have trouble with your balance or moving your arms or legs. These symptoms may represent a serious problem that is an emergency. Do not wait to see if the symptoms will go away. Get medical help right away. Call your local emergency services (911 in the [place] Do not drive yourself to the hospital. Summary After the liver biopsy, it is common to have pain, soreness, and bruising in the area,   as well as tiredness (fatigue). Take over-the-counter and prescription medicines only as told by your health care provider. Follow instructions from your health care provider about how to care for your incisions. Check your incision areas daily for signs of infection. This information is not intended to replace advice given to you by your health care provider. Make sure you discuss any questions you have with your health care provider. Document Revised: 12/07/2019 Document Reviewed: 12/07/2019 Elsevier Patient Education  2023 Elsevier Inc.  

## 2023-11-04 NOTE — Procedures (Signed)
 Interventional Radiology Procedure Note  Procedure: US   Guided Biopsy of liver  Complications: None  Estimated Blood Loss: < 10 mL  Findings: 18 G core biopsy of liver performed under US  guidance.  one core samples obtained and sent to Pathology.  George DELENA Banner, MD

## 2023-11-04 NOTE — Sedation Documentation (Signed)
 RN Ethelmae Ringel pulled 2mg  Versed  and 100mcg Fentanyl  in Ir room pysix. Pt. Received 2mg  Versed  and 100mcg Fentanyl  throughout the procedure.

## 2023-11-05 ENCOUNTER — Encounter: Payer: Self-pay | Admitting: Internal Medicine

## 2023-11-05 LAB — SURGICAL PATHOLOGY

## 2023-11-06 ENCOUNTER — Ambulatory Visit: Payer: Self-pay | Admitting: Internal Medicine

## 2023-11-06 ENCOUNTER — Other Ambulatory Visit: Payer: Self-pay

## 2023-11-06 DIAGNOSIS — Z7901 Long term (current) use of anticoagulants: Secondary | ICD-10-CM

## 2023-11-06 DIAGNOSIS — R7989 Other specified abnormal findings of blood chemistry: Secondary | ICD-10-CM

## 2023-11-20 ENCOUNTER — Telehealth: Payer: Self-pay | Admitting: Internal Medicine

## 2023-11-20 NOTE — Telephone Encounter (Signed)
 Inbound call from [facility] requesting to know if patient is still needing to be seen. States patient was referred in July for elevated liver functions. States they typically do not see patient for that diagnosis. Requesting a call back. Please advise, thank you

## 2023-11-21 NOTE — Telephone Encounter (Signed)
 Also his kappa free light chain which was initially slightly elevated when checked at [facility] 18 months ago remains slightly elevated.  I am not sure if this is significant.  I would like to get hematology opinion given the slight elevation.  Let him know I recommend that he see Dr. Lonn for her opinion regarding this abnormal test.  This was repeated after it was initially abnormal at [facility] when checked by [facility] hepatology.   Left message for new pt scheduler regarding info above for referral. Asked that they call back if any questions.

## 2023-11-21 NOTE — Telephone Encounter (Signed)
 Dr Lonn we made a referral for this pt but I do not think the dx was in correctly. Scheduling sent the message asking if the pt still needed to be seen for elevated LFT's. Please see the note below from Dr. Albertus:  Also his kappa free light chain which was initially slightly elevated when checked at [facility] 18 months ago remains slightly elevated.  I am not sure if this is significant.  I would like to get hematology opinion given the slight elevation.  Let him know I recommend that he see Dr. Lonn for her opinion regarding this abnormal test.  This was repeated after it was initially abnormal at [facility] when checked by [facility] hepatology.      I have tried to call back and speak with the referral coordinator and left a message but have not heard back. Please let me know if you can see this pt.

## 2023-11-22 ENCOUNTER — Telehealth: Payer: Self-pay | Admitting: Hematology and Oncology

## 2023-11-22 NOTE — Telephone Encounter (Signed)
Thanks so much for your help. 

## 2023-11-22 NOTE — Telephone Encounter (Signed)
 Thank you for the clarification I will get the new patient scheduler to schedule accordingly

## 2023-11-22 NOTE — Telephone Encounter (Signed)
 Called pt and lvm  inform him about his appt coming up

## 2023-12-03 ENCOUNTER — Inpatient Hospital Stay

## 2023-12-03 ENCOUNTER — Inpatient Hospital Stay: Attending: Hematology and Oncology | Admitting: Hematology and Oncology

## 2023-12-03 ENCOUNTER — Encounter: Payer: Self-pay | Admitting: Hematology and Oncology

## 2023-12-03 VITALS — BP 122/90 | HR 71 | Temp 98.3°F | Resp 18 | Ht 73.0 in | Wt 173.0 lb

## 2023-12-03 DIAGNOSIS — Z825 Family history of asthma and other chronic lower respiratory diseases: Secondary | ICD-10-CM | POA: Insufficient documentation

## 2023-12-03 DIAGNOSIS — Z79899 Other long term (current) drug therapy: Secondary | ICD-10-CM | POA: Insufficient documentation

## 2023-12-03 DIAGNOSIS — E8581 Light chain (AL) amyloidosis: Secondary | ICD-10-CM | POA: Diagnosis not present

## 2023-12-03 DIAGNOSIS — Z8249 Family history of ischemic heart disease and other diseases of the circulatory system: Secondary | ICD-10-CM | POA: Diagnosis not present

## 2023-12-03 DIAGNOSIS — J45909 Unspecified asthma, uncomplicated: Secondary | ICD-10-CM | POA: Insufficient documentation

## 2023-12-03 DIAGNOSIS — R748 Abnormal levels of other serum enzymes: Secondary | ICD-10-CM | POA: Insufficient documentation

## 2023-12-03 DIAGNOSIS — Z860100 Personal history of colon polyps, unspecified: Secondary | ICD-10-CM | POA: Insufficient documentation

## 2023-12-03 DIAGNOSIS — E860 Dehydration: Secondary | ICD-10-CM | POA: Insufficient documentation

## 2023-12-03 DIAGNOSIS — Z7901 Long term (current) use of anticoagulants: Secondary | ICD-10-CM | POA: Diagnosis not present

## 2023-12-03 DIAGNOSIS — R7689 Other specified abnormal immunological findings in serum: Secondary | ICD-10-CM | POA: Insufficient documentation

## 2023-12-03 DIAGNOSIS — Z86718 Personal history of other venous thrombosis and embolism: Secondary | ICD-10-CM | POA: Diagnosis not present

## 2023-12-03 NOTE — Assessment & Plan Note (Signed)
 He has chronic liver enzymes elevation since 2020 and had extensive evaluation with imaging studies and recent liver biopsy with indeterminate cause I suspect it could be due to his regular weightlifting and exercise in the setting of not eating enough protein We discussed importance of increasing protein intake In the future, I recommend the patient to refrain from strenuous activity prior to having his liver enzymes drawn

## 2023-12-03 NOTE — Progress Notes (Signed)
 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 The reason why the light chain studies were drawn is unknown Presumably, it was ordered to look for autoimmune disorder that could explain the cause of his elevated liver enzymes I suspect the patient is somewhat dehydrated, causing his kappa light chain to be slightly elevated above baseline This is not something that warrant additional workup He does not have anemia, renal failure, hypercalcemia or other constellation to suggest multiple myeloma He does not need further workup or follow-up in this regard  History of DVT (deep vein thrombosis) I am unable to review his records related to his prior history of thrombosis The patient has been on anticoagulation therapy for many years and appears to be prothrombotic due to possible prothrombin gene mutation He does not need further workup in this regard  Elevated liver enzymes He has chronic liver enzymes elevation since 2020 and had extensive evaluation with imaging studies and recent liver biopsy with indeterminate cause I suspect it could be due to his regular weightlifting and exercise in the setting of not eating enough protein We discussed importance of increasing protein intake In the future, I recommend the patient to refrain from strenuous activity prior to having his liver enzymes drawn  No orders of the defined types were placed in this encounter.   The total time spent in the appointment was 55 minutes encounter with patients including review of chart and various tests results, discussions about plan of care and coordination of care plan   All questions were answered. The patient knows to call the clinic with any problems, questions or concerns. No barriers to learning was detected.  [name], MD 10/28/20253:26 PM  CHIEF  COMPLAINTS/PURPOSE OF CONSULTATION:  Elevated kappa light chain level  HISTORY OF PRESENTING ILLNESS:  George Sharp 63 y.o. male is here because of elevated kappa light chain level He has very interesting history of prior remote history of blood clot and prothrombin gene mutation, treated with anticoagulation therapy The patient was noted to have elevated liver enzymes since the year of 2020 and have seen multiple specialist, imaging study, multiple different blood draws and recent liver biopsy  In December 2020, he had extensive blood work Myeloma panel was drawn and it came back normal  On April 05, 2022, he had extensive blood work again to look for causes of abnormal liver enzymes.  Immunoglobulin free light chain levels was drawn which showed elevated kappa light chain level at 2.44 mg/dL with normal ratio.  This is in the absence of anemia, abnormal kidney function, or hypercalcemia  On August 27, 2023, he had repeat blood work again with elevated kappa free light chain at 23.8 mg/L, again with normal ratio  He is being referred here to addressed the cause of elevated kappa light chain that was drawn in the setting of screening for autoimmune disorder without anything else to suggest multiple myeloma  The patient is an excellent historian.  He exercise a lot, doing weight lifting/resistance training at the gym at least 4 days or more in addition to walking a lot on a regular basis.  He rarely goes to bed unless he hit more than 10,000 steps per day.  He is of normal BMI.  I gone through his diet history and in general, he has very little protein intake  I have reviewed his  electronic records extensively  MEDICAL HISTORY:  Past Medical History:  Diagnosis Date   Allergic rhinitis    Asthma    B12 deficiency    Colon polyps    DVT (deep vein thrombosis) 2004   GERD (gastroesophageal reflux disease)    Laryngopharyngeal reflux (LPR)    On apixaban therapy    Prothrombin gene  mutation (Factor II)    a.) DVT treated with warfarin x 13 years prior to transitioning over to chronic DOAC (apixaban)    SURGICAL HISTORY: Past Surgical History:  Procedure Laterality Date   COLONOSCOPY     COLONOSCOPY WITH PROPOFOL  N/A 12/07/2016   Procedure: COLONOSCOPY WITH PROPOFOL ;  Surgeon: Viktoria Lamar DASEN, MD;  Location[facility] ENDOSCOPY;  Service: Endoscopy;  Laterality: N/A;   COLONOSCOPY WITH PROPOFOL  N/A 07/20/2020   Procedure: COLONOSCOPY WITH PROPOFOL ;  Surgeon: Toledo, Ladell POUR, MD;  Location: [facility] ENDOSCOPY;  Service: Gastroenterology;  Laterality: N/A;   FLEXIBLE SIGMOIDOSCOPY     FRACTURE SURGERY Right    thumb   HERNIA REPAIR     IR US  LIVER BIOPSY  11/04/2023   NASAL SINUS SURGERY     SHOULDER ARTHROSCOPY WITH SUBACROMIAL DECOMPRESSION, ROTATOR CUFF REPAIR AND BICEP TENDON REPAIR Left 06/08/2021   Procedure: LEFT SHOULDER ARTHROSCOPY WITH DEBRIDEMENT, DECOMPRESSION, DISTAL CLAVICLE EXCISION, ROTATOR CUFF REPAIR, AND BICEPS TENODESIS;  Surgeon: Edie Norleen PARAS, MD;  Location: [facility] ORS;  Service: Orthopedics;  Laterality: Left;   XI ROBOTIC ASSISTED INGUINAL HERNIA REPAIR WITH MESH Right 09/23/2023   Procedure: REPAIR, HERNIA, INGUINAL, ROBOT-ASSISTED, LAPAROSCOPIC, USING MESH;  Surgeon: Rodolph Romano, MD;  Location: [facility] ORS;  Service: General;  Laterality: Right;    SOCIAL HISTORY: Social History   Socioeconomic History   Marital status: Married    Spouse name: Mining Engineer M (Spouse)   Number of children: Not on file   Years of education: Not on file   Highest education level: Not on file  Occupational History   Not on file  Tobacco Use   Smoking status: Never   Smokeless tobacco: Never  Vaping Use   Vaping status: Never Used  Substance and Sexual Activity   Alcohol use: Not Currently   Drug use: No   Sexual activity: Not on file  Other Topics Concern   Not on file  Social History Narrative   Lives together with wife    2 daughters   Social  Drivers of Health   Financial Resource Strain: Low Risk  (10/03/2023)   Received from [facility]   Overall Financial Resource Strain (CARDIA)    Difficulty of Paying Living Expenses: Not hard at all  Food Insecurity: No Food Insecurity (10/03/2023)   Received from [facility]   Hunger Vital Sign    Within the past 12 months, you worried that your food would run out before you got the money to buy more.: Never true    Within the past 12 months, the food you bought just didn't last and you didn't have money to get more.: Never true  Transportation Needs: No Transportation Needs (10/03/2023)   Received from [facility] - Transportation    In the past 12 months, has lack of transportation kept you from medical appointments or from getting medications?: No    Lack of Transportation (Non-Medical): No  Physical Activity: Not on file  Stress: Not on file  Social Connections: Not on file  Intimate Partner Violence: Not on file    FAMILY  HISTORY: Family History  Problem Relation Age of Onset   Emphysema Mother    Aortic aneurysm Father    Colon cancer Neg Hx    Colon polyps Neg Hx    Esophageal cancer Neg Hx    Pancreatic cancer Neg Hx    Stomach cancer Neg Hx     ALLERGIES:  has no known allergies.  MEDICATIONS:  Current Outpatient Medications  Medication Sig Dispense Refill   acetaminophen  (TYLENOL ) 500 MG tablet Take 500 mg by mouth every 6 (six) hours as needed for moderate pain, mild pain or headache.     cetirizine (ZYRTEC) 10 MG tablet Take 10 mg by mouth daily.     Cholecalciferol 25 MCG (1000 UT) tablet Take 1,000 Units by mouth once a week.     ELIQUIS 5 MG TABS tablet Take 5 mg by mouth 2 (two) times daily.     omeprazole (PRILOSEC OTC) 20 MG tablet Take 20 mg by mouth as needed.     vitamin B-12 (CYANOCOBALAMIN ) 1000 MCG tablet Take 1,000 mcg by mouth once a week.     No current facility-administered  medications for this visit.    REVIEW OF SYSTEMS:   Constitutional: Denies fevers, chills or abnormal night sweats Eyes: Denies blurriness of vision, double vision or watery eyes Ears, nose, mouth, throat, and face: Denies mucositis or sore throat Respiratory: Denies cough, dyspnea or wheezes Cardiovascular: Denies palpitation, chest discomfort or lower extremity swelling Gastrointestinal:  Denies nausea, heartburn or change in bowel habits Skin: Denies abnormal skin rashes Lymphatics: Denies new lymphadenopathy or easy bruising Neurological:Denies numbness, tingling or new weaknesses Behavioral/Psych: Mood is stable, no new changes  All other systems were reviewed with the patient and are negative.  PHYSICAL EXAMINATION: ECOG PERFORMANCE STATUS: 0 - Asymptomatic  Vitals:   12/03/23 1409  BP: (!) 122/90  Pulse: 71  Resp: 18  Temp: 98.3 F (36.8 C)  SpO2: 100%   Filed Weights   12/03/23 1409  Weight: 173 lb (78.5 kg)    GENERAL:alert, no distress and comfortable PSYCH: alert & oriented x 3 with fluent speech  LABORATORY DATA:  I have reviewed the data as listed Lab Results  Component Value Date   WBC 5.8 11/04/2023   HGB 14.6 11/04/2023   HCT 45.5 11/04/2023   MCV 97.6 11/04/2023   PLT 206 11/04/2023   Recent Labs    08/27/23 1102 11/04/23 0815  NA  --  140  K  --  4.5  CL  --  106  CO2  --  25  GLUCOSE  --  93  BUN  --  8  CREATININE  --  0.77  CALCIUM  --  9.9  GFRNONAA  --  >60  PROT 7.3 7.3  ALBUMIN 4.4 4.4  AST 55* 70*  ALT 76* 106*  ALKPHOS 59 65  BILITOT 0.7 0.5  BILIDIR 0.1  --

## 2023-12-03 NOTE — Assessment & Plan Note (Signed)
 I am unable to review his records related to his prior history of thrombosis The patient has been on anticoagulation therapy for many years and appears to be prothrombotic due to possible prothrombin gene mutation He does not need further workup in this regard

## 2023-12-03 NOTE — Assessment & Plan Note (Signed)
 The reason why the light chain studies were drawn is unknown Presumably, it was ordered to look for autoimmune disorder that could explain the cause of his elevated liver enzymes I suspect the patient is somewhat dehydrated, causing his kappa light chain to be slightly elevated above baseline This is not something that warrant additional workup He does not have anemia, renal failure, hypercalcemia or other constellation to suggest multiple myeloma He does not need further workup or follow-up in this regard

## 2023-12-30 ENCOUNTER — Encounter: Payer: Self-pay | Admitting: Hematology and Oncology

## 2024-03-10 ENCOUNTER — Other Ambulatory Visit: Payer: Self-pay | Admitting: Internal Medicine

## 2024-03-10 DIAGNOSIS — R1031 Right lower quadrant pain: Secondary | ICD-10-CM

## 2024-03-11 ENCOUNTER — Other Ambulatory Visit

## 2024-03-11 ENCOUNTER — Ambulatory Visit
Admission: RE | Admit: 2024-03-11 | Discharge: 2024-03-11 | Disposition: A | Source: Ambulatory Visit | Attending: Internal Medicine | Admitting: Internal Medicine

## 2024-03-11 DIAGNOSIS — R1031 Right lower quadrant pain: Secondary | ICD-10-CM

## 2024-03-11 MED ORDER — IOPAMIDOL (ISOVUE-300) INJECTION 61%
100.0000 mL | Freq: Once | INTRAVENOUS | Status: AC | PRN
  Administered 2024-03-11: 100 mL via INTRAVENOUS

## 2024-03-12 ENCOUNTER — Other Ambulatory Visit: Payer: Self-pay | Admitting: Internal Medicine

## 2024-03-12 DIAGNOSIS — K8689 Other specified diseases of pancreas: Secondary | ICD-10-CM

## 2024-03-12 DIAGNOSIS — R1084 Generalized abdominal pain: Secondary | ICD-10-CM
